# Patient Record
Sex: Female | Born: 1935 | Race: White | Hispanic: No | Smoking: Never smoker
Health system: Southern US, Community
[De-identification: ages and names within clinical notes are randomized; demographics above are authoritative.]

## PROBLEM LIST (undated history)

## (undated) DIAGNOSIS — I639 Cerebral infarction, unspecified: Secondary | ICD-10-CM

## (undated) DIAGNOSIS — E7800 Pure hypercholesterolemia, unspecified: Secondary | ICD-10-CM

## (undated) DIAGNOSIS — I10 Essential (primary) hypertension: Secondary | ICD-10-CM

## (undated) DIAGNOSIS — I2510 Atherosclerotic heart disease of native coronary artery without angina pectoris: Secondary | ICD-10-CM

## (undated) HISTORY — PX: CHOLECYSTECTOMY: SHX55

---

## 2007-10-11 ENCOUNTER — Ambulatory Visit (HOSPITAL_COMMUNITY): Admission: RE | Admit: 2007-10-11 | Discharge: 2007-10-11 | Payer: Self-pay | Admitting: Family Medicine

## 2007-10-12 ENCOUNTER — Encounter: Payer: Self-pay | Admitting: Family Medicine

## 2007-10-15 ENCOUNTER — Ambulatory Visit (HOSPITAL_COMMUNITY): Admission: RE | Admit: 2007-10-15 | Discharge: 2007-10-15 | Payer: Self-pay | Admitting: Interventional Radiology

## 2008-09-29 ENCOUNTER — Ambulatory Visit (HOSPITAL_COMMUNITY): Admission: RE | Admit: 2008-09-29 | Discharge: 2008-09-29 | Payer: Self-pay | Admitting: Interventional Radiology

## 2010-05-01 ENCOUNTER — Other Ambulatory Visit (HOSPITAL_COMMUNITY): Payer: Self-pay | Admitting: Interventional Radiology

## 2010-05-01 DIAGNOSIS — I771 Stricture of artery: Secondary | ICD-10-CM

## 2010-05-04 ENCOUNTER — Ambulatory Visit (HOSPITAL_COMMUNITY): Payer: No Typology Code available for payment source

## 2010-05-04 ENCOUNTER — Ambulatory Visit (HOSPITAL_COMMUNITY)
Admission: RE | Admit: 2010-05-04 | Discharge: 2010-05-04 | Disposition: A | Discharge status: Home / Self Care | Payer: No Typology Code available for payment source | Source: Ambulatory Visit | Attending: Interventional Radiology | Admitting: Interventional Radiology

## 2010-05-04 DIAGNOSIS — I658 Occlusion and stenosis of other precerebral arteries: Secondary | ICD-10-CM | POA: Insufficient documentation

## 2010-05-04 DIAGNOSIS — R42 Dizziness and giddiness: Secondary | ICD-10-CM | POA: Insufficient documentation

## 2010-05-04 DIAGNOSIS — M6281 Muscle weakness (generalized): Secondary | ICD-10-CM | POA: Insufficient documentation

## 2010-05-04 DIAGNOSIS — I771 Stricture of artery: Secondary | ICD-10-CM

## 2010-05-04 DIAGNOSIS — I679 Cerebrovascular disease, unspecified: Secondary | ICD-10-CM | POA: Insufficient documentation

## 2010-05-04 LAB — BUN: BUN: 12 mg/dL (ref 6–23)

## 2010-05-04 LAB — CREATININE, SERUM: GFR calc Af Amer: >60 mL/min (ref >60)

## 2010-05-04 MED ORDER — GADOBENATE DIMEGLUMINE 529 MG/ML IV SOLN
18.0000 mL | Freq: Once | INTRAVENOUS | Status: AC
  Administered 2010-05-04: 18 mL via INTRAVENOUS

## 2010-06-17 LAB — CREATININE, SERUM: GFR calc non Af Amer: >60 mL/min (ref >60)

## 2010-12-07 LAB — BASIC METABOLIC PANEL
Chloride: 108
GFR calc Af Amer: >60
Potassium: 4
Sodium: 141

## 2010-12-07 LAB — CBC
HCT: 40
Hemoglobin: 13.1
MCV: 89.9
RBC: 4.46
WBC: 6.1

## 2010-12-07 LAB — PROTIME-INR: Prothrombin Time: 13.8

## 2011-09-23 ENCOUNTER — Telehealth (HOSPITAL_COMMUNITY): Payer: Self-pay

## 2011-09-23 ENCOUNTER — Other Ambulatory Visit (HOSPITAL_COMMUNITY): Payer: Self-pay | Admitting: Interventional Radiology

## 2011-09-23 DIAGNOSIS — I639 Cerebral infarction, unspecified: Secondary | ICD-10-CM

## 2011-09-23 DIAGNOSIS — Z8679 Personal history of other diseases of the circulatory system: Secondary | ICD-10-CM

## 2011-09-23 NOTE — Telephone Encounter (Signed)
I spoke with Ann Frye in regards to it being time for her f/u with Dr. Corliss Skains.  She stated that she had a mini stroke in March 2013.  Dr. Corliss Skains has requested a consult with the pt and for her to bring in a disc of the current scans that she had done at [facility]

## 2011-09-30 ENCOUNTER — Other Ambulatory Visit (HOSPITAL_COMMUNITY): Payer: Self-pay | Admitting: Interventional Radiology

## 2011-09-30 ENCOUNTER — Ambulatory Visit (HOSPITAL_COMMUNITY)
Admission: RE | Admit: 2011-09-30 | Discharge: 2011-09-30 | Disposition: A | Discharge status: Home / Self Care | Payer: No Typology Code available for payment source | Source: Ambulatory Visit | Attending: Interventional Radiology | Admitting: Interventional Radiology

## 2011-09-30 DIAGNOSIS — Z8679 Personal history of other diseases of the circulatory system: Secondary | ICD-10-CM

## 2011-09-30 DIAGNOSIS — I639 Cerebral infarction, unspecified: Secondary | ICD-10-CM

## 2011-10-01 ENCOUNTER — Other Ambulatory Visit: Payer: Self-pay | Admitting: Radiology

## 2011-10-03 ENCOUNTER — Encounter (HOSPITAL_COMMUNITY): Payer: Self-pay

## 2011-10-03 ENCOUNTER — Ambulatory Visit (HOSPITAL_COMMUNITY)
Admission: RE | Admit: 2011-10-03 | Discharge: 2011-10-03 | Disposition: A | Discharge status: Home / Self Care | Payer: No Typology Code available for payment source | Source: Ambulatory Visit | Attending: Interventional Radiology | Admitting: Interventional Radiology

## 2011-10-03 ENCOUNTER — Other Ambulatory Visit (HOSPITAL_COMMUNITY): Payer: Self-pay | Admitting: Interventional Radiology

## 2011-10-03 DIAGNOSIS — I2510 Atherosclerotic heart disease of native coronary artery without angina pectoris: Secondary | ICD-10-CM | POA: Insufficient documentation

## 2011-10-03 DIAGNOSIS — R42 Dizziness and giddiness: Secondary | ICD-10-CM | POA: Insufficient documentation

## 2011-10-03 DIAGNOSIS — I639 Cerebral infarction, unspecified: Secondary | ICD-10-CM

## 2011-10-03 DIAGNOSIS — I10 Essential (primary) hypertension: Secondary | ICD-10-CM | POA: Insufficient documentation

## 2011-10-03 DIAGNOSIS — Z8673 Personal history of transient ischemic attack (TIA), and cerebral infarction without residual deficits: Secondary | ICD-10-CM | POA: Insufficient documentation

## 2011-10-03 DIAGNOSIS — E785 Hyperlipidemia, unspecified: Secondary | ICD-10-CM | POA: Insufficient documentation

## 2011-10-03 DIAGNOSIS — I6529 Occlusion and stenosis of unspecified carotid artery: Secondary | ICD-10-CM | POA: Insufficient documentation

## 2011-10-03 DIAGNOSIS — I658 Occlusion and stenosis of other precerebral arteries: Secondary | ICD-10-CM | POA: Insufficient documentation

## 2011-10-03 HISTORY — DX: Essential (primary) hypertension: I10

## 2011-10-03 HISTORY — DX: Cerebral infarction, unspecified: I63.9

## 2011-10-03 HISTORY — DX: Pure hypercholesterolemia, unspecified: E78.00

## 2011-10-03 HISTORY — DX: Atherosclerotic heart disease of native coronary artery without angina pectoris: I25.10

## 2011-10-03 LAB — CBC WITH DIFFERENTIAL/PLATELET
Basophils Absolute: 0.1 10*3/uL (ref 0.0–0.1)
Basophils Relative: 1 % (ref 0–1)
Eosinophils Relative: 4 % (ref 0–5)
HCT: 44.2 % (ref 36.0–46.0)
Lymphocytes Relative: 30 % (ref 12–46)
MCHC: 33.3 g/dL (ref 30.0–36.0)
MCV: 84.7 fL (ref 78.0–100.0)
Monocytes Absolute: 0.8 10*3/uL (ref 0.1–1.0)
Monocytes Relative: 11 % (ref 3–12)
RDW: 13.5 % (ref 11.5–15.5)

## 2011-10-03 LAB — BASIC METABOLIC PANEL
BUN: 12 mg/dL (ref 6–23)
CO2: 26 mEq/L (ref 19–32)
Chloride: 104 mEq/L (ref 96–112)
Creatinine, Ser: 0.68 mg/dL (ref 0.50–1.10)

## 2011-10-03 MED ORDER — FENTANYL CITRATE 0.05 MG/ML IJ SOLN
INTRAMUSCULAR | Status: AC | PRN
  Administered 2011-10-03: 12.5 ug via INTRAVENOUS
  Administered 2011-10-03: 25 ug via INTRAVENOUS

## 2011-10-03 MED ORDER — IOHEXOL 300 MG/ML  SOLN
150.0000 mL | Freq: Once | INTRAMUSCULAR | Status: AC | PRN
  Administered 2011-10-03: 75 mL via INTRAVENOUS

## 2011-10-03 MED ORDER — HEPARIN SODIUM (PORCINE) 1000 UNIT/ML IJ SOLN
INTRAMUSCULAR | Status: AC | PRN
  Administered 2011-10-03: 500 [IU] via INTRAVENOUS

## 2011-10-03 MED ORDER — FENTANYL CITRATE 0.05 MG/ML IJ SOLN
INTRAMUSCULAR | Status: AC
  Filled 2011-10-03: qty 2

## 2011-10-03 MED ORDER — SODIUM CHLORIDE 0.9 % IV SOLN: INTRAVENOUS | Status: AC

## 2011-10-03 MED ORDER — MIDAZOLAM HCL 2 MG/2ML IJ SOLN
INTRAMUSCULAR | Status: AC
  Filled 2011-10-03: qty 2

## 2011-10-03 MED ORDER — SODIUM CHLORIDE 0.9 % IV SOLN
Freq: Once | INTRAVENOUS | Status: AC
  Administered 2011-10-03: 1000 mL via INTRAVENOUS

## 2011-10-03 MED ORDER — MIDAZOLAM HCL 5 MG/5ML IJ SOLN
INTRAMUSCULAR | Status: AC | PRN
  Administered 2011-10-03: 0.5 mg via INTRAVENOUS
  Administered 2011-10-03: 1 mg via INTRAVENOUS

## 2011-10-03 NOTE — ED Notes (Signed)
exoseal to R groin successful

## 2011-10-03 NOTE — H&P (Signed)
Ann Frye is an 78 y.o. female.   Chief Complaint: CVA 05/2011; MRI shows Left vertebral artery stenosis Pt stes she has occasional dizziness especially in mornings Feels like she tends to go to the left when she first gets up Last cerebral arteriogram 10/2007 - see report Scheduled for cerebral arteriogram to evaluate stenosis HPI: CAD; HTN; CVA/TIAs; hyperlipidemia  Past Medical History  Diagnosis Date  . Coronary artery disease     stent x 2  . Hypertension   . Stroke   . High cholesterol     Past Surgical History  Procedure Date  . Cholecystectomy     History reviewed. No pertinent family history. Social History:  reports that she has never smoked. She does not have any smokeless tobacco history on file. Her alcohol and drug histories not on file.  Allergies: No Known Allergies   (Not in a hospital admission)  Results for orders placed during the hospital encounter of 10/03/11 (from the past 48 hour(s))  APTT     Status: Normal   Collection Time   10/03/11  6:37 AM      Component Value Range Comment   aPTT 29  24 - 37 seconds   BASIC METABOLIC PANEL     Status: Abnormal   Collection Time   10/03/11  6:37 AM      Component Value Range Comment   Sodium 142  135 - 145 mEq/L    Potassium 3.8  3.5 - 5.1 mEq/L    Chloride 104  96 - 112 mEq/L    CO2 26  19 - 32 mEq/L    Glucose, Bld 110 (*) 70 - 99 mg/dL    BUN 12  6 - 23 mg/dL    Creatinine, Ser 9.56  0.50 - 1.10 mg/dL    Calcium 9.3  8.4 - 21.3 mg/dL    GFR calc non Af Amer 80 (*) >90 mL/min    GFR calc Af Amer >90  >90 mL/min   CBC WITH DIFFERENTIAL     Status: Abnormal   Collection Time   10/03/11  6:37 AM      Component Value Range Comment   WBC 7.0  4.0 - 10.5 K/uL    RBC 5.22 (*) 3.87 - 5.11 MIL/uL    Hemoglobin 14.7  12.0 - 15.0 g/dL    HCT 08.6  57.8 - 46.9 %    MCV 84.7  78.0 - 100.0 fL    MCH 28.2  26.0 - 34.0 pg    MCHC 33.3  30.0 - 36.0 g/dL    RDW 62.9  52.8 - 41.3 %    Platelets 231  150 - 400  K/uL    Neutrophils Relative 55  43 - 77 %    Neutro Abs 3.8  1.7 - 7.7 K/uL    Lymphocytes Relative 30  12 - 46 %    Lymphs Abs 2.1  0.7 - 4.0 K/uL    Monocytes Relative 11  3 - 12 %    Monocytes Absolute 0.8  0.1 - 1.0 K/uL    Eosinophils Relative 4  0 - 5 %    Eosinophils Absolute 0.3  0.0 - 0.7 K/uL    Basophils Relative 1  0 - 1 %    Basophils Absolute 0.1  0.0 - 0.1 K/uL   PROTIME-INR     Status: Normal   Collection Time   10/03/11  6:37 AM      Component Value Range Comment  Prothrombin Time 13.1  11.6 - 15.2 seconds    INR 0.97  0.00 - 1.49   GLUCOSE, CAPILLARY     Status: Normal   Collection Time   10/03/11  6:46 AM      Component Value Range Comment   Glucose-Capillary 99  70 - 99 mg/dL    No results found.  Review of Systems  Constitutional: Negative for fever.  Gastrointestinal: Negative for nausea and vomiting.  Neurological: Positive for dizziness and headaches.  Psychiatric/Behavioral: The patient is nervous/anxious.     Blood pressure 121/62, pulse 56, temperature 97.7 F (36.5 C), temperature source Oral, resp. rate 18, height 5' 6.5" (1.689 m), weight 161 lb (73.029 kg), SpO2 94.00%. Physical Exam  Constitutional: She is oriented to person, place, and time. She appears well-developed and well-nourished.  Cardiovascular: Normal rate, regular rhythm and normal heart sounds.   No murmur heard. Respiratory: Effort normal and breath sounds normal. She has no wheezes.  GI: Bowel sounds are normal. There is no tenderness.  Musculoskeletal: Normal range of motion.       Steady but slow; pt states she "tends to go to left when first gets up in am"  Neurological: She is alert and oriented to person, place, and time.  Psychiatric: She has a normal mood and affect. Her behavior is normal. Judgment and thought content normal.     Assessment/Plan CVA 05/2011 Dizziness occasionally; gait to left in am MRI shows L VA stenosis Scheduled now or cerebral arteriogram    Pt aware of procedure benefits and risks and agreeable to proceed. Consent signed and in chart  Pritesh Sobecki A 10/03/2011, 7:53 AM

## 2011-10-03 NOTE — Procedures (Signed)
S/P 4 vessel cerebral arteriogram RT CFA approach . Preliminary findings  1. App 30 % stenosis LT VBJ  2.Mild ASVD of Lt carotid bulb.

## 2011-10-10 ENCOUNTER — Ambulatory Visit (HOSPITAL_COMMUNITY): Payer: No Typology Code available for payment source

## 2012-05-27 ENCOUNTER — Other Ambulatory Visit: Payer: Self-pay | Admitting: Radiology

## 2012-05-27 DIAGNOSIS — I6529 Occlusion and stenosis of unspecified carotid artery: Secondary | ICD-10-CM

## 2012-05-29 ENCOUNTER — Telehealth (HOSPITAL_COMMUNITY): Payer: Self-pay | Admitting: Interventional Radiology

## 2012-08-07 ENCOUNTER — Other Ambulatory Visit (HOSPITAL_COMMUNITY): Payer: Self-pay | Admitting: Interventional Radiology

## 2012-08-07 DIAGNOSIS — R42 Dizziness and giddiness: Secondary | ICD-10-CM

## 2012-08-10 ENCOUNTER — Ambulatory Visit (HOSPITAL_COMMUNITY)
Admission: RE | Admit: 2012-08-10 | Discharge: 2012-08-10 | Disposition: A | Discharge status: Home / Self Care | Payer: Medicare Other | Source: Ambulatory Visit | Attending: Radiology | Admitting: Radiology

## 2012-08-10 ENCOUNTER — Ambulatory Visit (HOSPITAL_COMMUNITY)
Admission: RE | Admit: 2012-08-10 | Discharge: 2012-08-10 | Disposition: A | Discharge status: Home / Self Care | Payer: Medicare Other | Source: Ambulatory Visit | Attending: Interventional Radiology | Admitting: Interventional Radiology

## 2012-08-10 DIAGNOSIS — R42 Dizziness and giddiness: Secondary | ICD-10-CM

## 2012-08-10 DIAGNOSIS — I6529 Occlusion and stenosis of unspecified carotid artery: Secondary | ICD-10-CM

## 2012-08-10 DIAGNOSIS — I658 Occlusion and stenosis of other precerebral arteries: Secondary | ICD-10-CM | POA: Insufficient documentation

## 2012-08-10 NOTE — Progress Notes (Signed)
VASCULAR LAB PRELIMINARY  PRELIMINARY  PRELIMINARY  PRELIMINARY  Carotid duplex completed.    Preliminary report:  Right 40% to 59% ICA stenosis lower end of range. Vertebral artery flow is antegrade. Left - Less than 40% ICA stenosis. Vertebral artery flow is antegrade. There is no indication of significant change from carotid angiogram of 10/03/2011.  Duriel Deery, RVS 08/10/2012, 2:05 PM

## 2012-08-17 ENCOUNTER — Ambulatory Visit (HOSPITAL_COMMUNITY): Payer: No Typology Code available for payment source

## 2013-03-31 ENCOUNTER — Other Ambulatory Visit: Payer: Self-pay | Admitting: Radiology

## 2013-03-31 DIAGNOSIS — I6529 Occlusion and stenosis of unspecified carotid artery: Secondary | ICD-10-CM

## 2013-04-01 ENCOUNTER — Telehealth (HOSPITAL_COMMUNITY): Payer: Self-pay | Admitting: Interventional Radiology

## 2013-04-01 NOTE — Telephone Encounter (Signed)
Called pt's home # no answer and no VM JM

## 2013-04-01 NOTE — Telephone Encounter (Signed)
Called - left VM to call to schedule f/u appt JM

## 2013-04-13 ENCOUNTER — Ambulatory Visit (HOSPITAL_COMMUNITY)
Admission: RE | Admit: 2013-04-13 | Discharge: 2013-04-13 | Disposition: A | Discharge status: Home / Self Care | Payer: Medicare Other | Source: Ambulatory Visit | Attending: Interventional Radiology | Admitting: Interventional Radiology

## 2013-04-13 DIAGNOSIS — I658 Occlusion and stenosis of other precerebral arteries: Secondary | ICD-10-CM | POA: Insufficient documentation

## 2013-04-13 DIAGNOSIS — I6529 Occlusion and stenosis of unspecified carotid artery: Secondary | ICD-10-CM

## 2013-04-13 NOTE — Progress Notes (Signed)
Bilateral carotid artery duplex:  1-39% ICA stenosis.  Vertebral artery flow is antegrade.     

## 2013-04-19 ENCOUNTER — Telehealth (HOSPITAL_COMMUNITY): Payer: Self-pay | Admitting: Interventional Radiology

## 2013-04-19 NOTE — Telephone Encounter (Signed)
Called pt's daughter, left VM that we will f/u again with US in 6 months. JM

## 2013-04-20 ENCOUNTER — Encounter: Payer: Self-pay | Admitting: Interventional Radiology

## 2013-11-09 ENCOUNTER — Other Ambulatory Visit: Payer: Self-pay | Admitting: Radiology

## 2013-11-09 DIAGNOSIS — I6529 Occlusion and stenosis of unspecified carotid artery: Secondary | ICD-10-CM

## 2013-12-03 ENCOUNTER — Ambulatory Visit (HOSPITAL_COMMUNITY)
Admission: RE | Admit: 2013-12-03 | Discharge: 2013-12-03 | Disposition: A | Discharge status: Home / Self Care | Payer: Medicare Other | Source: Ambulatory Visit | Attending: Interventional Radiology | Admitting: Interventional Radiology

## 2013-12-03 DIAGNOSIS — I6529 Occlusion and stenosis of unspecified carotid artery: Secondary | ICD-10-CM

## 2013-12-03 NOTE — Progress Notes (Signed)
*  PRELIMINARY RESULTS* Vascular Ultrasound Carotid Duplex (Doppler) has been completed.  Preliminary findings: Bilateral:  1-39% ICA stenosis.  Vertebral artery flow is antegrade.      Farrel Demark, RDMS, RVT  12/03/2013, 2:07 PM

## 2013-12-09 ENCOUNTER — Telehealth (HOSPITAL_COMMUNITY): Payer: Self-pay | Admitting: Interventional Radiology

## 2013-12-09 NOTE — Telephone Encounter (Signed)
Called pt, told her that her next US would be due in 6 months time. She states understanding and is in agreement with this plan of care. JM

## 2014-06-03 ENCOUNTER — Other Ambulatory Visit: Payer: Self-pay | Admitting: Radiology

## 2014-06-03 DIAGNOSIS — I6529 Occlusion and stenosis of unspecified carotid artery: Secondary | ICD-10-CM

## 2014-06-17 ENCOUNTER — Ambulatory Visit (HOSPITAL_COMMUNITY)
Admission: RE | Admit: 2014-06-17 | Discharge: 2014-06-17 | Disposition: A | Discharge status: Home / Self Care | Payer: Medicare Other | Source: Ambulatory Visit | Attending: Radiology | Admitting: Radiology

## 2014-06-17 DIAGNOSIS — I6529 Occlusion and stenosis of unspecified carotid artery: Secondary | ICD-10-CM | POA: Diagnosis not present

## 2014-06-17 DIAGNOSIS — I6523 Occlusion and stenosis of bilateral carotid arteries: Secondary | ICD-10-CM | POA: Insufficient documentation

## 2014-06-17 NOTE — Progress Notes (Addendum)
Bilateral carotid artery duplex completed:  1-39% ICA stenosis.  Vertebral artery flow is antegrade.     

## 2014-08-03 ENCOUNTER — Other Ambulatory Visit (HOSPITAL_COMMUNITY): Payer: Self-pay | Admitting: Interventional Radiology

## 2014-08-03 DIAGNOSIS — M542 Cervicalgia: Secondary | ICD-10-CM

## 2014-08-03 DIAGNOSIS — R42 Dizziness and giddiness: Secondary | ICD-10-CM

## 2014-08-03 DIAGNOSIS — R413 Other amnesia: Secondary | ICD-10-CM

## 2014-08-05 ENCOUNTER — Ambulatory Visit (HOSPITAL_COMMUNITY)
Admission: RE | Admit: 2014-08-05 | Discharge: 2014-08-05 | Disposition: A | Discharge status: Home / Self Care | Payer: Medicare Other | Source: Ambulatory Visit | Attending: Interventional Radiology | Admitting: Interventional Radiology

## 2014-08-05 DIAGNOSIS — G319 Degenerative disease of nervous system, unspecified: Secondary | ICD-10-CM | POA: Insufficient documentation

## 2014-08-05 DIAGNOSIS — R9082 White matter disease, unspecified: Secondary | ICD-10-CM | POA: Diagnosis not present

## 2014-08-05 DIAGNOSIS — I739 Peripheral vascular disease, unspecified: Secondary | ICD-10-CM | POA: Insufficient documentation

## 2014-08-05 DIAGNOSIS — M542 Cervicalgia: Secondary | ICD-10-CM

## 2014-08-05 DIAGNOSIS — R413 Other amnesia: Secondary | ICD-10-CM

## 2014-08-05 DIAGNOSIS — R42 Dizziness and giddiness: Secondary | ICD-10-CM

## 2014-08-18 ENCOUNTER — Telehealth (HOSPITAL_COMMUNITY): Payer: Self-pay | Admitting: Interventional Radiology

## 2014-08-18 NOTE — Telephone Encounter (Signed)
Pt's daughter called and wanted to know the results of her mother's latest MRI report. Told pt's daughter that the MRI was stable and per Deveshwar that she would need a 6 month f/u US of the carotid arteries. She states understanding. JM

## 2014-11-29 DIAGNOSIS — I2510 Atherosclerotic heart disease of native coronary artery without angina pectoris: Secondary | ICD-10-CM | POA: Insufficient documentation

## 2014-11-29 DIAGNOSIS — E7800 Pure hypercholesterolemia, unspecified: Secondary | ICD-10-CM | POA: Insufficient documentation

## 2014-11-29 DIAGNOSIS — I10 Essential (primary) hypertension: Secondary | ICD-10-CM | POA: Insufficient documentation

## 2015-01-10 ENCOUNTER — Other Ambulatory Visit (HOSPITAL_COMMUNITY): Payer: Self-pay | Admitting: Interventional Radiology

## 2015-04-13 DIAGNOSIS — F32A Depression, unspecified: Secondary | ICD-10-CM | POA: Insufficient documentation

## 2015-04-13 DIAGNOSIS — I669 Occlusion and stenosis of unspecified cerebral artery: Secondary | ICD-10-CM | POA: Insufficient documentation

## 2015-04-13 DIAGNOSIS — R7301 Impaired fasting glucose: Secondary | ICD-10-CM | POA: Insufficient documentation

## 2015-04-13 DIAGNOSIS — L409 Psoriasis, unspecified: Secondary | ICD-10-CM | POA: Insufficient documentation

## 2015-04-13 DIAGNOSIS — F430 Acute stress reaction: Secondary | ICD-10-CM | POA: Insufficient documentation

## 2015-04-13 DIAGNOSIS — F320 Major depressive disorder, single episode, mild: Secondary | ICD-10-CM | POA: Insufficient documentation

## 2015-04-13 DIAGNOSIS — E039 Hypothyroidism, unspecified: Secondary | ICD-10-CM | POA: Insufficient documentation

## 2015-04-13 DIAGNOSIS — E559 Vitamin D deficiency, unspecified: Secondary | ICD-10-CM | POA: Insufficient documentation

## 2015-04-13 DIAGNOSIS — G4700 Insomnia, unspecified: Secondary | ICD-10-CM | POA: Insufficient documentation

## 2015-04-13 DIAGNOSIS — I2510 Atherosclerotic heart disease of native coronary artery without angina pectoris: Secondary | ICD-10-CM | POA: Insufficient documentation

## 2015-04-13 DIAGNOSIS — M810 Age-related osteoporosis without current pathological fracture: Secondary | ICD-10-CM | POA: Insufficient documentation

## 2015-04-25 ENCOUNTER — Telehealth (HOSPITAL_COMMUNITY): Payer: Self-pay

## 2015-04-25 NOTE — Telephone Encounter (Signed)
Called to schedule f/u US Carotid with Dr. Corliss Skains. Left VM for pt's daughter to call back. AW

## 2015-05-09 DIAGNOSIS — R0789 Other chest pain: Secondary | ICD-10-CM | POA: Insufficient documentation

## 2015-05-18 ENCOUNTER — Other Ambulatory Visit (HOSPITAL_COMMUNITY): Payer: Self-pay | Admitting: Interventional Radiology

## 2015-05-18 DIAGNOSIS — G450 Vertebro-basilar artery syndrome: Secondary | ICD-10-CM

## 2015-05-29 ENCOUNTER — Telehealth (HOSPITAL_COMMUNITY): Payer: Self-pay | Admitting: Interventional Radiology

## 2015-05-29 NOTE — Telephone Encounter (Signed)
Pt's daughter called and stated that pt wanted to cancel her US carotid Doppler study that was scheduled for 07/12/15. She said she would call us back to reschedule when her [facility]

## 2015-07-03 ENCOUNTER — Encounter (HOSPITAL_COMMUNITY): Payer: Medicare Other

## 2015-07-04 ENCOUNTER — Other Ambulatory Visit (HOSPITAL_COMMUNITY): Payer: Self-pay | Admitting: Interventional Radiology

## 2015-07-04 DIAGNOSIS — G450 Vertebro-basilar artery syndrome: Secondary | ICD-10-CM

## 2015-07-12 ENCOUNTER — Encounter (HOSPITAL_COMMUNITY): Payer: Medicare Other

## 2015-07-13 ENCOUNTER — Ambulatory Visit (HOSPITAL_COMMUNITY)
Admission: RE | Admit: 2015-07-13 | Discharge: 2015-07-13 | Disposition: A | Discharge status: Home / Self Care | Payer: Medicare Other | Source: Ambulatory Visit | Attending: Interventional Radiology | Admitting: Interventional Radiology

## 2015-07-13 DIAGNOSIS — G450 Vertebro-basilar artery syndrome: Secondary | ICD-10-CM

## 2015-07-13 DIAGNOSIS — I6523 Occlusion and stenosis of bilateral carotid arteries: Secondary | ICD-10-CM | POA: Diagnosis not present

## 2015-07-13 DIAGNOSIS — R413 Other amnesia: Secondary | ICD-10-CM | POA: Insufficient documentation

## 2015-07-13 NOTE — Progress Notes (Signed)
VASCULAR LAB PRELIMINARY  PRELIMINARY  PRELIMINARY  PRELIMINARY  Carotid duplex completed.    Preliminary report:  Bilateral:  1-39% ICA stenosis.  Vertebral artery flow is antegrade.     Ann Frye, RVS 07/13/2015, 3:34 PM

## 2015-07-20 ENCOUNTER — Telehealth (HOSPITAL_COMMUNITY): Payer: Self-pay

## 2015-07-20 NOTE — Telephone Encounter (Signed)
Called to have pt f/u in 6 months with an US Carotid. Spoke with pt's daughter and she agreed with this plan. AW

## 2016-01-12 DIAGNOSIS — F03918 Unspecified dementia, unspecified severity, with other behavioral disturbance: Secondary | ICD-10-CM | POA: Insufficient documentation

## 2016-01-12 DIAGNOSIS — F0391 Unspecified dementia with behavioral disturbance: Secondary | ICD-10-CM | POA: Insufficient documentation

## 2016-01-18 ENCOUNTER — Other Ambulatory Visit (HOSPITAL_COMMUNITY): Payer: Self-pay | Admitting: Interventional Radiology

## 2016-01-18 DIAGNOSIS — I771 Stricture of artery: Secondary | ICD-10-CM

## 2016-02-05 ENCOUNTER — Ambulatory Visit (HOSPITAL_COMMUNITY): Payer: Medicare Other

## 2016-02-12 ENCOUNTER — Ambulatory Visit (HOSPITAL_COMMUNITY)
Admission: RE | Admit: 2016-02-12 | Discharge: 2016-02-12 | Disposition: A | Discharge status: Home / Self Care | Payer: Medicare Other | Source: Ambulatory Visit | Attending: Interventional Radiology | Admitting: Interventional Radiology

## 2016-02-12 DIAGNOSIS — I771 Stricture of artery: Secondary | ICD-10-CM | POA: Insufficient documentation

## 2016-02-12 LAB — VAS US CAROTID
LCCADDIAS: -18 cm/s
LCCAPDIAS: 19 cm/s
LCCAPSYS: 88 cm/s
LEFT ECA DIAS: -8 cm/s
LEFT VERTEBRAL DIAS: -11 cm/s
LICADDIAS: -27 cm/s
LICADSYS: -71 cm/s
LICAPSYS: -108 cm/s
Left CCA dist sys: -66 cm/s
Left ICA prox dias: -24 cm/s
RCCADSYS: -81 cm/s
RIGHT ECA DIAS: -7 cm/s
RIGHT VERTEBRAL DIAS: -11 cm/s
Right CCA prox dias: 17 cm/s
Right CCA prox sys: 83 cm/s

## 2016-02-12 NOTE — Progress Notes (Signed)
*  PRELIMINARY RESULTS* Vascular Ultrasound Carotid Duplex (Doppler) has been completed.  Preliminary findings: Findings consistent with high end 40 - 59 percent stenosis involving the proximal right internal carotid artery.  Findings consistent with a 1- 39 percent stenosis involving the left internal carotid artery. Bilateral vertebral arteries are patent and antegrade.  Ann FischerCharlotte C Jessen Frye 02/12/2016, 12:13 PM

## 2016-02-15 ENCOUNTER — Telehealth (HOSPITAL_COMMUNITY): Payer: Self-pay

## 2016-02-15 NOTE — Telephone Encounter (Signed)
Pt agreed to f/u in 6 months with us carotid. AW 

## 2016-03-19 DIAGNOSIS — E538 Deficiency of other specified B group vitamins: Secondary | ICD-10-CM | POA: Insufficient documentation

## 2016-05-07 ENCOUNTER — Encounter: Payer: Self-pay | Admitting: Osteopathic Medicine

## 2016-05-07 ENCOUNTER — Ambulatory Visit (INDEPENDENT_AMBULATORY_CARE_PROVIDER_SITE_OTHER): Payer: Medicare HMO | Admitting: Osteopathic Medicine

## 2016-05-07 VITALS — BP 128/66 | HR 90 | Ht 66.5 in | Wt 145.0 lb

## 2016-05-07 DIAGNOSIS — R413 Other amnesia: Secondary | ICD-10-CM

## 2016-05-07 DIAGNOSIS — G4700 Insomnia, unspecified: Secondary | ICD-10-CM | POA: Diagnosis not present

## 2016-05-07 DIAGNOSIS — I10 Essential (primary) hypertension: Secondary | ICD-10-CM

## 2016-05-07 DIAGNOSIS — F32A Depression, unspecified: Secondary | ICD-10-CM

## 2016-05-07 DIAGNOSIS — F320 Major depressive disorder, single episode, mild: Secondary | ICD-10-CM | POA: Diagnosis not present

## 2016-05-07 DIAGNOSIS — F0391 Unspecified dementia with behavioral disturbance: Secondary | ICD-10-CM

## 2016-05-07 MED ORDER — NORTRIPTYLINE HCL 50 MG PO CAPS: ORAL_CAPSULE | ORAL | 1 refills | Status: DC
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
## 2016-06-04 ENCOUNTER — Encounter: Payer: Self-pay | Admitting: Osteopathic Medicine

## 2016-06-04 ENCOUNTER — Ambulatory Visit (INDEPENDENT_AMBULATORY_CARE_PROVIDER_SITE_OTHER): Payer: Medicare HMO | Admitting: Osteopathic Medicine

## 2016-06-04 VITALS — BP 146/66 | HR 91 | Ht 66.5 in | Wt 143.0 lb

## 2016-06-04 DIAGNOSIS — Z8639 Personal history of other endocrine, nutritional and metabolic disease: Secondary | ICD-10-CM | POA: Diagnosis not present

## 2016-06-04 DIAGNOSIS — Z780 Asymptomatic menopausal state: Secondary | ICD-10-CM | POA: Diagnosis not present

## 2016-06-04 DIAGNOSIS — W57XXXA Bitten or stung by nonvenomous insect and other nonvenomous arthropods, initial encounter: Secondary | ICD-10-CM

## 2016-06-04 DIAGNOSIS — H1131 Conjunctival hemorrhage, right eye: Secondary | ICD-10-CM | POA: Diagnosis not present

## 2016-06-04 DIAGNOSIS — F320 Major depressive disorder, single episode, mild: Secondary | ICD-10-CM | POA: Diagnosis not present

## 2016-06-04 DIAGNOSIS — F0391 Unspecified dementia with behavioral disturbance: Secondary | ICD-10-CM | POA: Diagnosis not present

## 2016-06-04 DIAGNOSIS — Z0000 Encounter for general adult medical examination without abnormal findings: Secondary | ICD-10-CM | POA: Diagnosis not present

## 2016-06-04 DIAGNOSIS — F32A Depression, unspecified: Secondary | ICD-10-CM

## 2016-06-04 MED ORDER — HYDROXYZINE HCL 25 MG PO TABS: 25.0000 mg | ORAL_TABLET | Freq: Three times a day (TID) | ORAL | 1 refills | Status: DC | PRN

## 2016-06-04 MED ORDER — TRIAMCINOLONE ACETONIDE 0.1 % EX CREA: 1.0000 "application " | TOPICAL_CREAM | Freq: Two times a day (BID) | CUTANEOUS | 1 refills | Status: DC

## 2016-06-04 MED ORDER — NITROGLYCERIN 0.4 MG SL SUBL: 0.4000 mg | SUBLINGUAL_TABLET | SUBLINGUAL | 1 refills | Status: DC | PRN

## 2016-06-04 NOTE — Progress Notes (Signed)
HPI: Ann FennelDarleen Frye is a 83 y.o. female  who [facility] today, 06/04/16,  for chief complaint of:  Chief Complaint  Patient presents with  . Follow-up    DEPRESSION  . Eye Problem    RIGHT EYE RED     Depression/Anxiety: Acclimating to new living environment but still bothered by noises from adjacent units (music, dishwashers, comings and goings of neighbors). Overall doing well on new medicine and PHQ reflects this. Dementia comorbid, no change   Eye concern: . Context: no known injuty . Location: Right eye . Quality:red, nonpainful . Duration: 2 weeks maybe  Rash: . Location: arms, legs, neck . Quality: itching . Duration: few weeks, not sure . Context: brings bugs in plastic bag with her ... Bedbugs!  Inquires about B12 supplementation, used ot get shots.   Patient is accompanied by daughter who assists with history-taking.   Past medical history, surgical history, social history and family history reviewed.  Patient Active Problem List   Diagnosis Date Noted  . Vitamin B12 deficiency 03/19/2016  . Dementia with behavioral disturbance 01/12/2016  . Memory difficulty 07/13/2015  . Atypical chest pain 05/09/2015  . Acute stress reaction 04/13/2015  . Age related osteoporosis 04/13/2015  . Cerebral artery occlusion 04/13/2015  . Coronary artery stenosis 04/13/2015  . Hypothyroidism 04/13/2015  . IFG (impaired fasting glucose) 04/13/2015  . Insomnia 04/13/2015  . Mild depression (HCC) 04/13/2015  . Psoriasis 04/13/2015  . Vitamin D deficiency 04/13/2015  . ASCVD (arteriosclerotic cardiovascular disease) 11/29/2014  . Hypercholesteremia 11/29/2014  . Hypertension 11/29/2014    Current medication list and allergy/intolerance information reviewed.   Current Outpatient Prescriptions on File Prior to Visit  Medication Sig Dispense Refill  . alendronate (FOSAMAX) 70 MG tablet Take 70 mg by mouth every 7 (seven) days.      Marland Kitchen. aspirin 81 MG tablet Take 81 mg by mouth daily.    . clopidogrel (PLAVIX) 75 MG tablet Take 75 mg by mouth daily.    Marland Kitchen. donepezil (ARICEPT) 10 MG tablet Take 10 mg by mouth at bedtime.    Marland Kitchen. levothyroxine (SYNTHROID, LEVOTHROID) 25 MCG tablet Take 25 mcg by mouth daily.    . metoprolol (LOPRESSOR) 50 MG tablet Take 50 mg by mouth 2 (two) times daily.    . nortriptyline (PAMELOR) 50 MG capsule Take 1 tablet (50 mg total) by mouth in the morning and 2 tablets (100 mg total) by mouth in the evening 90 capsule 1  . rosuvastatin (CRESTOR) 40 MG tablet Take 40 mg by mouth at bedtime.     No current facility-administered medications on file prior to visit.    No Known Allergies    Review of Systems:  Constitutional: No recent illness  HEENT: No  headache, no vision change, red eye as per HPI  Cardiac: No  chest pain,  Respiratory:  No  shortness of breath. No  Musculoskeletal: No new myalgia/arthralgia  Skin: +Rash  Neurologic: No  weakness, No  Dizziness  Psychiatric: No  concerns with depression, No  concerns with anxiety  Exam:  BP (!) 146/66   Pulse 91   Ht 5' 6.5" (1.689 m)   Wt 143 lb (64.9 kg)   BMI 22.74 kg/m   Constitutional: VS see above. General Appearance: alert, well-developed, well-nourished, NAD  Eyes: Normal lids and conjunctive, non-icteric sclera  Ears, Nose, Mouth, Throat: MMM, Normal external inspection ears/nares/mouth/lips/gums.  Neck: No masses, trachea midline.   Respiratory: Normal respiratory effort.  no wheeze, no rhonchi, no rales  Cardiovascular: S1/S2 normal, no murmur, no rub/gallop auscultated. RRR.   Musculoskeletal: Gait normal. Symmetric and independent movement of all extremities  Neurological: Normal balance/coordination. No tremor.  Skin: warm, dry, intact. (+)maculopapulat rash, excoriations   Psychiatric: Normal judgment/insight. Normal mood and affect. Oriented x3.         Depression screen [facility] 2/9 06/04/2016   Decreased Interest 0  Down, Depressed, Hopeless 0  PHQ - 2 Score 0      ASSESSMENT/PLAN:   Mild depression (HCC) - doing well on current meds, refills x6 mos ok  Subconjunctival hemorrhage of right eye - f/u ophtho if no better. Nonpainful and no vision compromise  Postmenopausal - Plan: VITAMIN D 25 Hydroxy (Vit-D Deficiency, Fractures)  History of non anemic vitamin B12 deficiency - Plan: Vitamin B12  Annual physical exam - Plan: CBC with Differential/Platelet, COMPLETE METABOLIC PANEL WITH GFR, Lipid panel, TSH, VITAMIN D 25 Hydroxy (Vit-D Deficiency, Fractures), Vitamin B12  Insect bite, initial encounter - bedbugs - printed infor given, talk to landlord ASAP - Plan: triamcinolone cream (KENALOG) 0.1 %, hydrOXYzine (ATARAX/VISTARIL) 25 MG tablet  Dementia with behavioral disturbance, unspecified dementia type    Follow-up plan: Return in about 6 weeks (around 07/16/2016) for ANNUAL PHYSICAL. Note annual labs were ordered today but preventive care exam was not performed or billed  Visit summary with medication list and pertinent instructions was printed for patient to review, alert [place] if any changes needed. All questions at time of visit were answered - patient instructed to contact office with any additional concerns. ER/RTC precautions were reviewed with the patient and understanding verbalized.

## 2016-06-12 ENCOUNTER — Telehealth: Payer: Self-pay

## 2016-06-12 NOTE — Telephone Encounter (Signed)
PT needs a refill on this medication. Please send to pharmacy on file.  Nortriptyline  - 90 day supply

## 2016-06-13 MED ORDER — NORTRIPTYLINE HCL 50 MG PO CAPS: ORAL_CAPSULE | ORAL | 1 refills | Status: DC

## 2016-06-13 NOTE — Telephone Encounter (Signed)
Refill request for Nortriptyline sent to patient pharmacy. Ann Frye,CMA

## 2016-06-13 NOTE — Addendum Note (Signed)
Addended by: Pixie Casino on: 06/13/2016 09:54 AM   Modules accepted: Orders

## 2016-07-09 ENCOUNTER — Encounter: Payer: Medicare HMO | Admitting: Osteopathic Medicine

## 2016-07-19 ENCOUNTER — Ambulatory Visit: Payer: Medicare HMO | Admitting: Sports Medicine

## 2016-08-14 ENCOUNTER — Other Ambulatory Visit (HOSPITAL_COMMUNITY): Payer: Self-pay | Admitting: Interventional Radiology

## 2016-08-14 DIAGNOSIS — I771 Stricture of artery: Secondary | ICD-10-CM

## 2016-09-04 ENCOUNTER — Encounter (HOSPITAL_COMMUNITY): Payer: Medicare HMO

## 2016-09-10 ENCOUNTER — Encounter (HOSPITAL_COMMUNITY): Payer: Self-pay

## 2016-09-10 ENCOUNTER — Ambulatory Visit (HOSPITAL_COMMUNITY): Payer: Medicare HMO

## 2016-09-21 DIAGNOSIS — I25118 Atherosclerotic heart disease of native coronary artery with other forms of angina pectoris: Secondary | ICD-10-CM | POA: Insufficient documentation

## 2016-10-14 ENCOUNTER — Ambulatory Visit (INDEPENDENT_AMBULATORY_CARE_PROVIDER_SITE_OTHER): Payer: Medicare HMO | Admitting: Osteopathic Medicine

## 2016-10-14 ENCOUNTER — Encounter: Payer: Self-pay | Admitting: Osteopathic Medicine

## 2016-10-14 VITALS — BP 115/68 | HR 80 | Resp 18 | Wt 137.1 lb

## 2016-10-14 DIAGNOSIS — Z8673 Personal history of transient ischemic attack (TIA), and cerebral infarction without residual deficits: Secondary | ICD-10-CM

## 2016-10-14 DIAGNOSIS — F320 Major depressive disorder, single episode, mild: Secondary | ICD-10-CM | POA: Diagnosis not present

## 2016-10-14 DIAGNOSIS — E538 Deficiency of other specified B group vitamins: Secondary | ICD-10-CM | POA: Diagnosis not present

## 2016-10-14 DIAGNOSIS — G4700 Insomnia, unspecified: Secondary | ICD-10-CM | POA: Diagnosis not present

## 2016-10-14 DIAGNOSIS — E559 Vitamin D deficiency, unspecified: Secondary | ICD-10-CM | POA: Diagnosis not present

## 2016-10-14 DIAGNOSIS — F32A Depression, unspecified: Secondary | ICD-10-CM

## 2016-10-14 DIAGNOSIS — I2510 Atherosclerotic heart disease of native coronary artery without angina pectoris: Secondary | ICD-10-CM | POA: Diagnosis not present

## 2016-10-14 MED ORDER — NORTRIPTYLINE HCL 50 MG PO CAPS: ORAL_CAPSULE | ORAL | 3 refills | Status: DC

## 2016-10-14 NOTE — Progress Notes (Signed)
HPI: Ann Frye is a 83 y.o. female  who presents to [facility] today, 10/14/16,  for chief complaint of:  Chief Complaint  Patient presents with  . Follow-up    Depression/Anxiety: Doing well on current medications. Taking nortriptyline 1 in the morning and 2 in the evening of the 50 mg pills. On home medication list, escitalopram is noted but the patient doesn't think she is taking this.   Inquires about B12 supplementation, used to get shots. Patient did not get labs as ordered back in March.  History of TIA: On Plavix, not sure how long she was supposed to stay on this medicine. Has not had follow-up with her neurologist for some time. MRI shows chronic small vessel disease, no major infarcts. She is also taking low-dose aspirin. She is on Crestor 40 mg.  Memory problems: Doing well on donepezil. No major changes/cognitive decline.  Patient is accompanied by daughter who assists with history-taking.   Past medical history, surgical history, social history and family history reviewed.  Patient Active Problem List   Diagnosis Date Noted  . Vitamin B12 deficiency 03/19/2016  . Dementia with behavioral disturbance 01/12/2016  . Memory difficulty 07/13/2015  . Atypical chest pain 05/09/2015  . Acute stress reaction 04/13/2015  . Age related osteoporosis 04/13/2015  . Cerebral artery occlusion 04/13/2015  . Coronary artery stenosis 04/13/2015  . Hypothyroidism 04/13/2015  . IFG (impaired fasting glucose) 04/13/2015  . Insomnia 04/13/2015  . Mild depression (HCC) 04/13/2015  . Psoriasis 04/13/2015  . Vitamin D deficiency 04/13/2015  . ASCVD (arteriosclerotic cardiovascular disease) 11/29/2014  . Hypercholesteremia 11/29/2014  . Hypertension 11/29/2014    Current medication list and allergy/intolerance information reviewed.   Current Outpatient Prescriptions on File Prior to Visit  Medication Sig Dispense Refill  . alendronate (FOSAMAX)  70 MG tablet Take 70 mg by mouth every 7 (seven) days.    Marland Kitchen aspirin 81 MG tablet Take 81 mg by mouth daily.    . clopidogrel (PLAVIX) 75 MG tablet Take 75 mg by mouth daily.    Marland Kitchen donepezil (ARICEPT) 10 MG tablet Take 10 mg by mouth at bedtime.    Marland Kitchen levothyroxine (SYNTHROID, LEVOTHROID) 25 MCG tablet Take 25 mcg by mouth daily.    . metoprolol (LOPRESSOR) 50 MG tablet Take 50 mg by mouth 2 (two) times daily.    . nitroGLYCERIN (NITROSTAT) 0.4 MG SL tablet Place 1 tablet (0.4 mg total) under the tongue every 5 (five) minutes as needed. For chest pain 90 tablet 1  . rosuvastatin (CRESTOR) 40 MG tablet Take 40 mg by mouth at bedtime.     No current facility-administered medications on file prior to visit.    No Known Allergies    Review of Systems:  Constitutional: No recent illness  HEENT: No  headache, no vision change, red eye as per HPI  Cardiac: No  chest pain,  Respiratory:  No  shortness of breath. No  Musculoskeletal: No new myalgia/arthralgia  Neurologic: No  weakness, No  Dizziness  Psychiatric: No  concerns with depression, No  concerns with anxiety  Exam:  BP 115/68   Pulse 80   Resp 18   Wt 137 lb 1.6 oz (62.2 kg)   BMI 21.80 kg/m   Constitutional: VS see above. General Appearance: alert, well-developed, well-nourished, NAD  Eyes: Normal lids and conjunctive, non-icteric sclera  Ears, Nose, Mouth, Throat: MMM, Normal external inspection ears/nares/mouth/lips/gums.  Neck: No masses, trachea midline.   Respiratory: Normal respiratory  effort. no wheeze, no rhonchi, no rales  Cardiovascular: S1/S2 normal, no murmur, no rub/gallop auscultated. RRR.   Musculoskeletal: Gait normal. Symmetric and independent movement of all extremities  Neurological: Normal balance/coordination. No tremor.  Skin: warm, dry, intact. (+)maculopapulat rash, excoriations   Psychiatric: Normal judgment/insight. Normal mood and affect. Oriented x3.    [facility]  06/04/2016  Decreased Interest 0  Down, Depressed, Hopeless 0  PHQ - 2 Score 0   No results found for this or any previous visit (from the past 2160 hour(s)).  Labs were re-printed for patient, we need updated blood work for further management.   ASSESSMENT/PLAN:   Mild depression (HCC) - Doing well on TCA, also covering for insomnia issues. Family will confirm whether patient is still taking the citalopram but it sounds like she isn't.  Insomnia, unspecified type - Doing well on TCA as noted above  ASCVD (arteriosclerotic cardiovascular disease) - Previously seen by cardiologist, unable to follow-up since new insurance/location. Would like to establish with a cardiologist for routine care/follow-up - Plan: Ambulatory referral to Cardiology  Vitamin D deficiency  Vitamin B12 deficiency  History of transient ischemic attack (TIA) - Caution w/ ASA plus Plavix - advised f/u with neurology.     Follow-up plan: Return in about 6 months (around 04/16/2017) for [facility]NUAL [facility]YSICAL, sooner if needed . Note annual labs were ordered today but preventive care exam was not performed or billed  Visit summary with medication list and pertinent instructions was printed for patient to review, alert us if any changes needed. All questions at time of visit were answered - patient instructed to contact office with any additional concerns. ER/RTC precautions were reviewed with the patient and understanding verbalized.

## 2016-10-29 LAB — CBC WITH DIFFERENTIAL/PLATELET
BASOS ABS: 72 {cells}/uL (ref 0–200)
Basophils Relative: 1 %
EOS PCT: 2 %
Eosinophils Absolute: 144 cells/uL (ref 15–500)
HCT: 43.6 % (ref 35.0–45.0)
Hemoglobin: 14.7 g/dL (ref 11.7–15.5)
Lymphocytes Relative: 30 %
Lymphs Abs: 2160 cells/uL (ref 850–3900)
MCH: 28.3 pg (ref 27.0–33.0)
MCHC: 33.7 g/dL (ref 32.0–36.0)
MCV: 83.8 fL (ref 80.0–100.0)
MONOS PCT: 9 %
MPV: 9.1 fL (ref 7.5–12.5)
Monocytes Absolute: 648 cells/uL (ref 200–950)
Neutro Abs: 4176 cells/uL (ref 1500–7800)
Neutrophils Relative %: 58 %
PLATELETS: 252 10*3/uL (ref 140–400)
RBC: 5.2 MIL/uL — ABNORMAL HIGH (ref 3.80–5.10)
RDW: 14.2 % (ref 11.0–15.0)
WBC: 7.2 10*3/uL (ref 3.8–10.8)

## 2016-10-29 LAB — COMPLETE METABOLIC PANEL WITH GFR
ALT: 29 U/L (ref 6–29)
AST: 34 U/L (ref 10–35)
Albumin: 4.3 g/dL (ref 3.6–5.1)
Alkaline Phosphatase: 56 U/L (ref 33–130)
BUN: 10 mg/dL (ref 7–25)
CHLORIDE: 104 mmol/L (ref 98–110)
CO2: 27 mmol/L (ref 20–32)
Calcium: 9.3 mg/dL (ref 8.6–10.4)
Creat: 0.79 mg/dL (ref 0.60–0.88)
GFR, EST NON AFRICAN AMERICAN: 68 mL/min (ref >=60)
GFR, Est African American: 78 mL/min (ref >=60)
GLUCOSE: 88 mg/dL (ref 65–99)
POTASSIUM: 4.1 mmol/L (ref 3.5–5.3)
SODIUM: 141 mmol/L (ref 135–146)
TOTAL PROTEIN: 6.7 g/dL (ref 6.1–8.1)
Total Bilirubin: 1.2 mg/dL (ref 0.2–1.2)

## 2016-10-29 LAB — LIPID PANEL
Cholesterol: 161 mg/dL (ref <200)
HDL: 64 mg/dL (ref >50)
LDL Cholesterol: 80 mg/dL (ref <100)
Total CHOL/HDL Ratio: 2.5 Ratio (ref <5.0)
Triglycerides: 83 mg/dL (ref <150)
VLDL: 17 mg/dL (ref <30)

## 2016-10-29 LAB — TSH: TSH: 2.69 m[IU]/L

## 2016-10-30 LAB — VITAMIN B12: Vitamin B-12: 857 pg/mL (ref 200–1100)

## 2016-10-30 LAB — VITAMIN D 25 HYDROXY (VIT D DEFICIENCY, FRACTURES): Vit D, 25-Hydroxy: 31 ng/mL (ref 30–100)

## 2016-11-15 DIAGNOSIS — M21372 Foot drop, left foot: Secondary | ICD-10-CM | POA: Insufficient documentation

## 2016-11-27 ENCOUNTER — Other Ambulatory Visit: Payer: Self-pay | Admitting: Osteopathic Medicine

## 2016-11-30 ENCOUNTER — Other Ambulatory Visit: Payer: Self-pay | Admitting: Osteopathic Medicine

## 2017-01-01 DIAGNOSIS — S065X9A Traumatic subdural hemorrhage with loss of consciousness of unspecified duration, initial encounter: Secondary | ICD-10-CM | POA: Insufficient documentation

## 2017-01-01 DIAGNOSIS — G939 Disorder of brain, unspecified: Secondary | ICD-10-CM

## 2017-01-01 DIAGNOSIS — R9089 Other abnormal findings on diagnostic imaging of central nervous system: Secondary | ICD-10-CM | POA: Insufficient documentation

## 2017-01-01 DIAGNOSIS — S065XAA Traumatic subdural hemorrhage with loss of consciousness status unknown, initial encounter: Secondary | ICD-10-CM | POA: Insufficient documentation

## 2017-01-01 DIAGNOSIS — W19XXXA Unspecified fall, initial encounter: Secondary | ICD-10-CM | POA: Insufficient documentation

## 2017-01-02 DIAGNOSIS — R768 Other specified abnormal immunological findings in serum: Secondary | ICD-10-CM | POA: Insufficient documentation

## 2017-01-02 DIAGNOSIS — R7689 Other specified abnormal immunological findings in serum: Secondary | ICD-10-CM | POA: Insufficient documentation

## 2017-01-14 DIAGNOSIS — W19XXXA Unspecified fall, initial encounter: Secondary | ICD-10-CM | POA: Insufficient documentation

## 2017-01-14 DIAGNOSIS — S0990XA Unspecified injury of head, initial encounter: Secondary | ICD-10-CM | POA: Insufficient documentation

## 2017-02-05 ENCOUNTER — Telehealth: Payer: Self-pay | Admitting: Emergency Medicine

## 2017-02-05 NOTE — Telephone Encounter (Signed)
Sounds good

## 2017-02-05 NOTE — Telephone Encounter (Signed)
Case worker Pam called requesting orders for this patient; I suggested she list the orders needed and fax them to us so we can systematically go through them and send appropriate orders. She will do this.pk

## 2017-02-06 ENCOUNTER — Ambulatory Visit (INDEPENDENT_AMBULATORY_CARE_PROVIDER_SITE_OTHER): Payer: Medicare HMO | Admitting: Osteopathic Medicine

## 2017-02-06 ENCOUNTER — Encounter: Payer: Self-pay | Admitting: Osteopathic Medicine

## 2017-02-06 VITALS — BP 130/71 | HR 86 | Temp 98.3°F | Resp 16 | Wt 122.0 lb

## 2017-02-06 DIAGNOSIS — E871 Hypo-osmolality and hyponatremia: Secondary | ICD-10-CM

## 2017-02-06 DIAGNOSIS — S065XAA Traumatic subdural hemorrhage with loss of consciousness status unknown, initial encounter: Secondary | ICD-10-CM

## 2017-02-06 DIAGNOSIS — I25118 Atherosclerotic heart disease of native coronary artery with other forms of angina pectoris: Secondary | ICD-10-CM

## 2017-02-06 DIAGNOSIS — I2510 Atherosclerotic heart disease of native coronary artery without angina pectoris: Secondary | ICD-10-CM | POA: Diagnosis not present

## 2017-02-06 DIAGNOSIS — F0391 Unspecified dementia with behavioral disturbance: Secondary | ICD-10-CM | POA: Diagnosis not present

## 2017-02-06 DIAGNOSIS — R262 Difficulty in walking, not elsewhere classified: Secondary | ICD-10-CM | POA: Diagnosis not present

## 2017-02-06 DIAGNOSIS — S065X9A Traumatic subdural hemorrhage with loss of consciousness of unspecified duration, initial encounter: Secondary | ICD-10-CM

## 2017-02-06 MED ORDER — ROLLER WALKER MISC: 1.0000 [IU] | 99 refills | Status: DC | PRN

## 2017-02-06 MED ORDER — NORTRIPTYLINE HCL 25 MG PO CAPS: 25.0000 mg | ORAL_CAPSULE | Freq: Every day | ORAL | 1 refills | Status: DC

## 2017-02-06 MED ORDER — MELATONIN 3 MG PO CAPS: 6.0000 mg | ORAL_CAPSULE | Freq: Every evening | ORAL | 11 refills | Status: DC | PRN

## 2017-02-06 MED ORDER — DIMENHYDRINATE 50 MG PO TABS: 50.0000 mg | ORAL_TABLET | Freq: Four times a day (QID) | ORAL | 0 refills | Status: DC | PRN

## 2017-02-06 MED ORDER — BATH BENCH WITH BACK MISC: 1.0000 [IU] | 99 refills | Status: DC | PRN

## 2017-02-06 MED ORDER — METOPROLOL TARTRATE 25 MG PO TABS: 25.0000 mg | ORAL_TABLET | Freq: Four times a day (QID) | ORAL | 3 refills | Status: DC

## 2017-02-06 NOTE — Patient Instructions (Addendum)
Plan:  Will get home health orders once they send it to me to sign off on  OK to restart Nortriptyline, let's try lower dose and increase as tolerated - should help anxiety/depression and sleep issues   Labs today to recheck sodium levels

## 2017-02-06 NOTE — Progress Notes (Signed)
HPI: Ann Frye is a 83 y.o. female who  has a past medical history of Coronary artery disease, High cholesterol, Hypertension, and Stroke (HCC).  [facility] today, 02/06/17,  for chief complaint of:  Chief Complaint  Patient presents with  . Follow-up    Hospital - Fall 12/31/2016    Discharge summary reviewed: admitted for subdural hematoma on 01/01/17 and d/c 01/817. Hx CVA 2012. Seizure-like activity 01/11/17. Hyponatremia. Changed ASA to low dose, D/C Plavix. See below for other meds on discharge - these were reconciled.   Today needs: Patent examinerWalker Bench for shower Order for PT/OT  HTN: BP ok today  Depression: SNF d/c nortriptyline d/t concerns for possible side effects. She reports anxiety and insomnia much worse lately.    Patient is accompanied by daughter, Ann Frye, who assists with history-taking.   Past medical, surgical, social and family history reviewed:  Patient Active Problem List   Diagnosis Date Noted  . Vitamin B12 deficiency 03/19/2016  . Dementia with behavioral disturbance 01/12/2016  . Memory difficulty 07/13/2015  . Atypical chest pain 05/09/2015  . Acute stress reaction 04/13/2015  . Age related osteoporosis 04/13/2015  . Cerebral artery occlusion 04/13/2015  . Coronary artery stenosis 04/13/2015  . Hypothyroidism 04/13/2015  . IFG (impaired fasting glucose) 04/13/2015  . Insomnia 04/13/2015  . Mild depression (HCC) 04/13/2015  . Psoriasis 04/13/2015  . Vitamin D deficiency 04/13/2015  . ASCVD (arteriosclerotic cardiovascular disease) 11/29/2014  . Hypercholesteremia 11/29/2014  . Hypertension 11/29/2014    Past Surgical History:  Procedure Laterality Date  . CHOLECYSTECTOMY      Social History   Tobacco Use  . Smoking status: Never Smoker  . Smokeless tobacco: Never Used  Substance Use Topics  . Alcohol use: Not on file    Current medication list and allergy/intolerance  information reviewed:    Discharge Medication List: Current Discharge Medication List   START taking these medications  Details  acetaminophen (TYLENOL) 325 MG tablet Take 2 tablets (650 mg total) by mouth every 4 (four) hours as needed (Mild pain or fever > 101.5 F).  amLODIPine (NORVASC) 10 MG tablet Take 1 tablet (10 mg total) by mouth daily.  HYDROcodone-acetaminophen (NORCO) 5-325 mg per tablet Take 1 tablet by mouth every 6 (six) hours as needed for up to 7 days for Severe pain. Qty: 10 tablet, Refills: 0  levETIRAcetam (KEPPRA) 750 MG tablet Take 2 tablets (1,500 mg total) by mouth 2 times daily.  lisinopril (PRINIVIL,ZESTRIL) 40 MG tablet Take 1 tablet (40 mg total) by mouth daily.  melatonin 3 mg Tab Take 2 tablets (6 mg total) by mouth nightly. Refills: 0  metoPROLOL tartrate (LOPRESSOR) 25 MG tablet Take 1 tablet (25 mg total) by mouth every 6 (six) hours.  polyethylene glycol (MIRALAX) 17 gram packet Take 17 g by mouth 2 times daily.  sodium chloride 1 gram tablet Take 1 tablet (1,000 mg total) by mouth 3 times daily.  tamsulosin (FLOMAX) 0.4 mg Cp24 capsule Take 1 capsule (0.4 mg total) by mouth daily.   CONTINUE these medications which have CHANGED  Details  aspirin 81 MG EC tablet *ANTIPLATELET* Take 1 tablet (81 mg total) by mouth daily. May resume on 01/29/17   CONTINUE these medications which have NOT CHANGED  Details  alendronate (FOSAMAX) 70 MG tablet TAKE 1 TABLET(70 MG) BY MOUTH EVERY 7 DAYS Qty: 12 tablet, Refills: 3  cholecalciferol, vitamin D3, 2,000 unit Cap Take 6,000 Units by mouth daily.  Associated Diagnoses: Vitamin D deficiency  cyanocobalamin (VITAMIN B-12) 1000 MCG tablet Take 1,000 mcg by mouth daily.  dimenhyDRINATE (DRAMAMINE) 50 MG tablet Take 50 mg by mouth nightly as needed.  donepezil (ARICEPT) 10 MG tablet TAKE 1 TABLET NIGHTLY. Qty: 90 tablet, Refills: 0  escitalopram oxalate (LEXAPRO) 10 MG tablet Take 1 tablet (10 mg total) by mouth  daily. Qty: 30 tablet, Refills: 2  levothyroxine (SYNTHROID, LEVOTHROID) 25 MCG tablet Take 25 mcg by mouth.  multivitamin Chew Take 1 tablet by mouth daily.  nitroglycerin (NITROSTAT) 0.4 MG SL tablet Place 0.4 mg under the tongue every 5 (five) minutes as needed for Chest pain.  nortriptyline (PAMELOR) 50 MG capsule Take 50 mg by mouth 2 times daily.  rosuvastatin (CRESTOR) 40 MG tablet TAKE 1 TABLET BY MOUTH AT BEDTIME Qty: 90 tablet, Refills: 3  meclizine (ANTIVERT) 25 MG tablet Take 25 mg by mouth 3 (three) times daily as needed.    STOP taking these medications  clopidogrel (PLAVIX) 75 mg tablet *ANTIPLATELET*  metoPROLOL succinate (TOPROL-XL) 50 MG 24 hr tablet     Allergies  Allergen Reactions  . Codeine   . Metoclopramide Other (See Comments)    unknown   . Zinc Other (See Comments)    unknown       Review of Systems:  Constitutional:  No  fever, no chills  HEENT: No  headache, no vision change  Cardiac: No  chest pain, No  pressure, No palpitations  Respiratory:  No  shortness of breath. No  Cough  Gastrointestinal: No  abdominal pain, No  nausea  Neurologic: No  weakness, No  dizziness, No  slurred speech/focal weakness/facial droop  Psychiatric: +concerns with depression, +concerns with anxiety, +sleep problems, +mood problems  Exam:  BP 130/71 (BP Location: Right Arm, Patient Position: Sitting, Cuff Size: Large)   Pulse 86   Temp 98.3 F (36.8 C) (Oral)   Resp 16   Wt 122 lb (55.3 kg)   SpO2 94%   BMI 19.40 kg/m   Constitutional: VS see above. General Appearance: alert, well-developed, well-nourished, NAD  Eyes: Normal lids and conjunctive, non-icteric sclera  Ears, Nose, Mouth, Throat: MMM, Normal external inspection ears/nares/mouth/lips/gums.   Neck: No masses, trachea midline.   Respiratory: Normal respiratory effort. no wheeze, no rhonchi, no rales  Cardiovascular: S1/S2 normal, no murmur, no rub/gallop auscultated. RRR.    Musculoskeletal: Gait normal. No clubbing/cyanosis of digits.   Neurological: Normal balance/coordination. No tremor.   Skin: warm, dry, intact. No rash/ulcer.    Psychiatric: Normal judgment/insight. Normal mood and affect. Oriented x3.    No results found for this or any previous visit (from the past 72 hour(s)).  No results found.   ASSESSMENT/PLAN:   SDH (subdural hematoma) (HCC) - following with neurosurgery  ASCVD (arteriosclerotic cardiovascular disease) - d.c plavix d/t above   Dementia with behavioral disturbance, unspecified dementia type - agitation and depression/anxiety, will restart nortriptyline   Coronary artery disease of native heart with stable angina pectoris, unspecified vessel or lesion type (HCC)  Hyponatremia - not on sodium tabs, wlil recheck levels - Plan: BASIC METABOLIC PANEL WITH GFR    Patient Instructions  Plan:  Will get home health orders once they send it to me to sign off on  OK to restart Nortriptyline, let's try lower dose and increase as tolerated - should help anxiety/depression and sleep issues   Labs today to recheck sodium levels     Visit summary with medication list and pertinent instructions  was printed for patient to review. All questions at time of visit were answered - patient instructed to contact office with any additional concerns. ER/RTC precautions were reviewed with the patient. Follow-up plan: Return in about 3 months (around 05/08/2017) for recheck chronic medical issues .  Note: Total time spent 25 minutes, greater than 50% of the visit was spent face-to-face counseling and coordinating care for the following: The primary encounter diagnosis was SDH (subdural hematoma) (HCC). 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 Please contact Dr. Lyn Hollingshead for any needed clarifications.

## 2017-02-07 ENCOUNTER — Telehealth (HOSPITAL_COMMUNITY): Payer: Self-pay

## 2017-02-07 ENCOUNTER — Other Ambulatory Visit: Payer: Self-pay | Admitting: Osteopathic Medicine

## 2017-02-07 DIAGNOSIS — E876 Hypokalemia: Secondary | ICD-10-CM

## 2017-02-07 LAB — BASIC METABOLIC PANEL WITH GFR
BUN / CREAT RATIO: 15 (calc) (ref 6–22)
BUN: 7 mg/dL (ref 7–25)
CHLORIDE: 108 mmol/L (ref 98–110)
CO2: 27 mmol/L (ref 20–32)
Calcium: 8.9 mg/dL (ref 8.6–10.4)
Creat: 0.46 mg/dL — ABNORMAL LOW (ref 0.60–0.88)
GFR, EST AFRICAN AMERICAN: 104 mL/min/{1.73_m2} (ref >=60)
GFR, Est Non African American: 90 mL/min/{1.73_m2} (ref >=60)
Glucose, Bld: 101 mg/dL — ABNORMAL HIGH (ref 65–99)
POTASSIUM: 3.4 mmol/L — AB (ref 3.5–5.3)
Sodium: 144 mmol/L (ref 135–146)

## 2017-02-07 NOTE — Telephone Encounter (Signed)
Called pt's daughter to schedule us carotid, no answer, mailbox full. AW

## 2017-02-07 NOTE — Progress Notes (Signed)
Lab add on

## 2017-02-10 ENCOUNTER — Other Ambulatory Visit: Payer: Self-pay

## 2017-02-10 ENCOUNTER — Telehealth: Payer: Self-pay | Admitting: Osteopathic Medicine

## 2017-02-10 DIAGNOSIS — R262 Difficulty in walking, not elsewhere classified: Secondary | ICD-10-CM

## 2017-02-10 MED ORDER — ROLLER WALKER MISC: 99 refills | Status: DC

## 2017-02-10 NOTE — Telephone Encounter (Signed)
Sounds good, thanks.

## 2017-02-10 NOTE — Telephone Encounter (Signed)
Ann Frye from [facility] called for a verbal order for:  2x week for 2 weeks, then 1x week for one week.  Verbal order given.

## 2017-02-20 ENCOUNTER — Ambulatory Visit (INDEPENDENT_AMBULATORY_CARE_PROVIDER_SITE_OTHER): Payer: Medicare HMO

## 2017-02-20 ENCOUNTER — Ambulatory Visit: Payer: Medicare HMO | Admitting: Osteopathic Medicine

## 2017-02-20 ENCOUNTER — Encounter: Payer: Self-pay | Admitting: Osteopathic Medicine

## 2017-02-20 VITALS — BP 107/62 | HR 73 | Temp 97.7°F | Wt 123.1 lb

## 2017-02-20 DIAGNOSIS — R609 Edema, unspecified: Secondary | ICD-10-CM | POA: Diagnosis not present

## 2017-02-20 DIAGNOSIS — M79605 Pain in left leg: Secondary | ICD-10-CM | POA: Diagnosis not present

## 2017-02-20 DIAGNOSIS — I739 Peripheral vascular disease, unspecified: Secondary | ICD-10-CM

## 2017-02-20 NOTE — Progress Notes (Signed)
HPI: Ann Frye is a 83 y.o. female who  has a past medical history of Coronary artery disease, High cholesterol, Hypertension, and Stroke (HCC).  she presents to [facility] today, 02/20/17,  for chief complaint of:  Chief Complaint  Patient presents with  . Leg Swelling     . Context: concerned about blood poisoning, concerned about loose screw post-op site from fracture fixation 20+ years ago LLE . Location: bilateral lower legs . Quality: redness, mild swelling . Severity: better today  . Duration: 2-3 days . Timing: constant  . Modifying factors: walking and elevating legs has helped  . Assoc signs/symptoms: no SOB, no chest pain.      Past medical, surgical, social and family history reviewed:  Patient Active Problem List   Diagnosis Date Noted  . Ambulatory dysfunction 02/06/2017  . Hyponatremia 02/06/2017  . Accident due to mechanical fall without injury 01/14/2017  . Head trauma 01/14/2017  . Red blood cell antibody positive 01/02/2017  . Fall 01/01/2017  . Midline shift of brain 01/01/2017  . SDH (subdural hematoma) (HCC) 01/01/2017  . Foot drop, left 11/15/2016  . Coronary artery disease of native heart with stable angina pectoris (HCC) 09/21/2016  . Vitamin B12 deficiency 03/19/2016  . Dementia with behavioral disturbance 01/12/2016  . Memory difficulty 07/13/2015  . Atypical chest pain 05/09/2015  . Acute stress reaction 04/13/2015  . Age related osteoporosis 04/13/2015  . Cerebral artery occlusion 04/13/2015  . Coronary artery stenosis 04/13/2015  . Hypothyroidism 04/13/2015  . IFG (impaired fasting glucose) 04/13/2015  . Insomnia 04/13/2015  . Mild depression (HCC) 04/13/2015  . Psoriasis 04/13/2015  . Vitamin D deficiency 04/13/2015  . ASCVD (arteriosclerotic cardiovascular disease) 11/29/2014  . Hypercholesteremia 11/29/2014  . Hypertension 11/29/2014    Past Surgical History:  Procedure Laterality Date   . CHOLECYSTECTOMY      Social History   Tobacco Use  . Smoking status: Never Smoker  . Smokeless tobacco: Never Used  Substance Use Topics  . Alcohol use: Not on file    No family history on file.   Current medication list and allergy/intolerance information reviewed:    Current Outpatient Medications  Medication Sig Dispense Refill  . acetaminophen (TYLENOL) 325 MG tablet Take 650 mg by mouth as needed.    Marland Kitchen alendronate (FOSAMAX) 70 MG tablet Take 70 mg by mouth every 7 (seven) days.    Marland Kitchen amLODipine (NORVASC) 10 MG tablet Take 1 tablet by mouth daily.  0  . aspirin 81 MG tablet Take 81 mg by mouth daily.    . Cholecalciferol (VITAMIN D3) 2000 units capsule Take by mouth daily.    Marland Kitchen dimenhyDRINATE (DRAMAMINE) 50 MG tablet Take 1 tablet (50 mg total) by mouth every 6 (six) hours as needed (sleep). 30 tablet 0  . donepezil (ARICEPT) 10 MG tablet Take 10 mg by mouth at bedtime.    Marland Kitchen escitalopram (LEXAPRO) 10 MG tablet Take 1 tablet by mouth daily.  0  . FLUAD 0.5 ML SUSY Inject 0.5 mLs into the muscle as directed.  0  . HYDROcodone-acetaminophen (NORCO/VICODIN) 5-325 MG tablet Take 1 tablet by mouth as needed.  0  . levETIRAcetam (KEPPRA) 750 MG tablet Take 1,500 mg by mouth 2 (two) times daily.    Marland Kitchen levothyroxine (SYNTHROID, LEVOTHROID) 25 MCG tablet Take 25 mcg by mouth daily.    Marland Kitchen lisinopril (PRINIVIL,ZESTRIL) 40 MG tablet Take 1 tablet by mouth daily.  0  . meclizine (ANTIVERT) 25 MG  tablet Take 12.5-25 mg by mouth 3 (three) times daily as needed for dizziness or nausea.    . Melatonin 3 MG CAPS Take 2 capsules (6 mg total) by mouth at bedtime as needed (insomnia). 60 capsule 11  . metoprolol tartrate (LOPRESSOR) 25 MG tablet Take 1 tablet (25 mg total) by mouth every 6 (six) hours. 270 tablet 3  . Misc. Devices (BATH BENCH WITH BACK) MISC 1 Units by Does not apply route as needed (with showering/bathing). 1 each prn  . Misc. Devices (ROLLER WALKER) MISC Rollator walker with  back support. Dx R26.2 1 each prn  . nitroGLYCERIN (NITROSTAT) 0.4 MG SL tablet Place 1 tablet (0.4 mg total) under the tongue every 5 (five) minutes as needed. For chest pain 90 tablet 1  . nortriptyline (PAMELOR) 25 MG capsule Take 1 capsule (25 mg total) by mouth at bedtime. 90 capsule 1  . polyethylene glycol powder (GLYCOLAX/MIRALAX) powder Take 225 g by mouth as directed.  0  . rosuvastatin (CRESTOR) 40 MG tablet Take 40 mg by mouth at bedtime.    . tamsulosin (FLOMAX) 0.4 MG CAPS capsule Take 0.4 mg by mouth daily.    . vitamin B-12 (CYANOCOBALAMIN) 1000 MCG tablet Take 1 tablet by mouth daily.     No current facility-administered medications for this visit.     Allergies  Allergen Reactions  . Codeine   . Metoclopramide Other (See Comments)    unknown   . Zinc Other (See Comments)    unknown       Review of Systems:  Constitutional:  No  fever, no chills, No recent illness, No unintentional weight changes. No significant fatigue.   HEENT: No  headache, no vision change  Cardiac: No  chest pain, No  pressure, No palpitations, No  Orthopnea  Respiratory:  No  shortness of breath. No  Cough  Gastrointestinal: No  abdominal pain, No  nausea  Musculoskeletal: No new myalgia/arthralgia  Skin: No  Rash, No other wounds/concerning lesions  Neurologic: No  weakness, No  dizziness   Exam:  BP 107/62   Pulse 73   Temp 97.7 F (36.5 C) (Oral)   Wt 123 lb 1.3 oz (55.8 kg)   BMI 19.57 kg/m   Constitutional: VS see above. General Appearance: alert, well-developed, well-nourished, NAD  Eyes: Normal lids and conjunctive, non-icteric sclera  Ears, Nose, Mouth, Throat: MMM, Normal external inspection ears/nares/mouth/lips/gums.   Neck: No masses, trachea midline. No thyroid enlargement. No tenderness/mass appreciated. No lymphadenopathy  Respiratory: Normal respiratory effort. no wheeze, no rhonchi, no rales  Cardiovascular: S1/S2 normal, +mild systolic murmur at  RSB, no rub/gallop auscultated. RRR. Trace lower extremity edema. Pedal pulse II/IV bilaterally DP and PT. No carotid bruit or JVD.  Musculoskeletal: Gait normal. No clubbing/cyanosis of digits. Plapable surgical screw in LLE medial malleolus but feels stable to me  Neurological: Normal balance/coordination. No tremor.   Skin: warm, dry, intact. No rash/ulcer, small excoriation/abrasion LLE laterally but no erythema No concerning nevi or subq nodules on limited exam.    Psychiatric: Fair judgment/insight. Normal mood and affect. Oriented x3.    No results found for this or any previous visit (from the past 72 hour(s)).  No results found.   ASSESSMENT/PLAN:   Dependent edema - sounds like this improved on its own w/ elevation legs and walking, advised no clinical HF or s/s DVT or cellulitis, avoid scratching dry skin. Monitor  - Plan: CBC, COMPLETE METABOLIC PANEL WITH GFR, B Nat Peptide  Left  leg pain - Plan: DG Tibia/Fibula Left, Ambulatory referral to Orthopedic Surgery       Visit summary with medication list and pertinent instructions was printed for patient to review. All questions at time of visit were answered - patient instructed to contact office with any additional concerns. ER/RTC precautions were reviewed with the patient.   Follow-up plan: Return if symptoms worsen or fail to improve.  Note: Total time spent 25 minutes, greater than 50% of the visit was spent face-to-face counseling and coordinating care for the following: The primary encounter diagnosis was Dependent edema. A diagnosis of Left leg pain was also pertinent to this visit.Marland Kitchen.  Please note: voice recognition software was used to produce this document, and typos may escape review. Please contact Dr. Lyn HollingsheadAlexander for any needed clarifications.

## 2017-02-20 NOTE — Patient Instructions (Signed)
Edema Edema is when you have too much fluid in your body or under your skin. Edema may make your legs, feet, and ankles swell up. Swelling is also common in looser tissues, like around your eyes. This is a common condition. It gets more common as you get older. There are many possible causes of edema. Eating too much salt (sodium) and being on your feet or sitting for a long time can cause edema in your legs, feet, and ankles. Hot weather may make edema worse. Edema is usually painless. Your skin may look swollen or shiny. Follow these instructions at home:  Keep the swollen body part raised (elevated) above the level of your heart when you are sitting or lying down.  Do not sit still or stand for a long time.  Do not wear tight clothes. Do not wear garters on your upper legs.  Exercise your legs. This can help the swelling go down.  Wear elastic bandages or support stockings as told by your doctor.  Eat a low-salt (low-sodium) diet to reduce fluid as told by your doctor.  Depending on the cause of your swelling, you may need to limit how much fluid you drink (fluid restriction).  Take over-the-counter and prescription medicines only as told by your doctor. Contact a doctor if:  Treatment is not working.  You have heart, liver, or kidney disease and have symptoms of edema.  You have sudden and unexplained weight gain. Get help right away if:  You have shortness of breath or chest pain.  You cannot breathe when you lie down.  You have pain, redness, or warmth in the swollen areas.  You have heart, liver, or kidney disease and get edema all of a sudden.  You have a fever and your symptoms get worse all of a sudden. Summary  Edema is when you have too much fluid in your body or under your skin.  Edema may make your legs, feet, and ankles swell up. Swelling is also common in looser tissues, like around your eyes.  Raise (elevate) the swollen body part above the level of your  heart when you are sitting or lying down.  Follow your doctor's instructions about diet and how much fluid you can drink (fluid restriction). This information is not intended to replace advice given to you by your health care provider. Make sure you discuss any questions you have with your health care provider. Document Released: 08/14/2007 Document Revised: 03/15/2016 Document Reviewed: 03/15/2016 Elsevier Interactive Patient Education  2017 [employer]  

## 2017-02-21 ENCOUNTER — Other Ambulatory Visit: Payer: Self-pay | Admitting: Osteopathic Medicine

## 2017-02-21 DIAGNOSIS — R011 Cardiac murmur, unspecified: Secondary | ICD-10-CM

## 2017-02-21 DIAGNOSIS — R600 Localized edema: Secondary | ICD-10-CM

## 2017-02-21 LAB — CBC
HCT: 39.9 % (ref 35.0–45.0)
Hemoglobin: 13 g/dL (ref 11.7–15.5)
MCH: 28.3 pg (ref 27.0–33.0)
MCHC: 32.6 g/dL (ref 32.0–36.0)
MCV: 86.9 fL (ref 80.0–100.0)
MPV: 10 fL (ref 7.5–12.5)
PLATELETS: 254 10*3/uL (ref 140–400)
RBC: 4.59 10*6/uL (ref 3.80–5.10)
RDW: 13 % (ref 11.0–15.0)
WBC: 5.2 10*3/uL (ref 3.8–10.8)

## 2017-02-21 LAB — COMPLETE METABOLIC PANEL WITH GFR
AG Ratio: 1.8 (calc) (ref 1.0–2.5)
ALKALINE PHOSPHATASE (APISO): 55 U/L (ref 33–130)
ALT: 31 U/L — AB (ref 6–29)
AST: 39 U/L — AB (ref 10–35)
Albumin: 3.9 g/dL (ref 3.6–5.1)
BUN: 7 mg/dL (ref 7–25)
CALCIUM: 8.9 mg/dL (ref 8.6–10.4)
CO2: 28 mmol/L (ref 20–32)
CREATININE: 0.64 mg/dL (ref 0.60–0.88)
Chloride: 108 mmol/L (ref 98–110)
GFR, EST NON AFRICAN AMERICAN: 81 mL/min/{1.73_m2} (ref >=60)
GFR, Est African American: 94 mL/min/{1.73_m2} (ref >=60)
GLUCOSE: 109 mg/dL — AB (ref 65–99)
Globulin: 2.2 g/dL (calc) (ref 1.9–3.7)
POTASSIUM: 3.8 mmol/L (ref 3.5–5.3)
Sodium: 145 mmol/L (ref 135–146)
Total Bilirubin: 0.9 mg/dL (ref 0.2–1.2)
Total Protein: 6.1 g/dL (ref 6.1–8.1)

## 2017-02-21 LAB — BRAIN NATRIURETIC PEPTIDE: Brain Natriuretic Peptide: 216 pg/mL — ABNORMAL HIGH (ref <100)

## 2017-02-21 NOTE — Progress Notes (Signed)
echo

## 2017-02-22 ENCOUNTER — Encounter: Payer: Self-pay | Admitting: Osteopathic Medicine

## 2017-02-24 ENCOUNTER — Telehealth: Payer: Self-pay | Admitting: Osteopathic Medicine

## 2017-02-24 MED ORDER — LISINOPRIL 40 MG PO TABS: 40.0000 mg | ORAL_TABLET | Freq: Every day | ORAL | 3 refills | Status: DC

## 2017-02-24 MED ORDER — TAMSULOSIN HCL 0.4 MG PO CAPS: 0.4000 mg | ORAL_CAPSULE | Freq: Every day | ORAL | 3 refills | Status: DC

## 2017-02-24 MED ORDER — AMLODIPINE BESYLATE 10 MG PO TABS: 10.0000 mg | ORAL_TABLET | Freq: Every day | ORAL | 3 refills | Status: DC

## 2017-02-24 NOTE — Telephone Encounter (Signed)
Tamsulosin 0.4 mg. (1 daily at bedtime) Amlodipine Besylate 10mg . (1 daily) Lisinopril 40mg . (1 daily at breakfast)

## 2017-02-25 ENCOUNTER — Telehealth: Payer: Self-pay | Admitting: Osteopathic Medicine

## 2017-02-25 NOTE — Telephone Encounter (Signed)
Noted.  Cannot force the patient to participate

## 2017-02-25 NOTE — Telephone Encounter (Signed)
[facility] and wanted you to know that this pt missed her PT visit on 12/8 due her scheduling conflict of her own. Physical therapist tried to reschedule her for Saturday and pt declined to reschedule. KG LPN

## 2017-02-27 ENCOUNTER — Telehealth: Payer: Self-pay

## 2017-02-27 NOTE — Telephone Encounter (Signed)
Ok to extend PT - if they'll accept verbal order - cool - if not please ask them to fax order  FYI to them, they can always fax order rather than play phone tag with us

## 2017-02-28 NOTE — Telephone Encounter (Signed)
Left a brief voicemail message to return call back to office regarding extension for PT.

## 2017-03-06 ENCOUNTER — Other Ambulatory Visit: Payer: Self-pay

## 2017-03-06 ENCOUNTER — Encounter: Payer: Self-pay | Admitting: Osteopathic Medicine

## 2017-03-06 MED ORDER — LISINOPRIL 40 MG PO TABS: 40.0000 mg | ORAL_TABLET | Freq: Every day | ORAL | 3 refills | Status: DC

## 2017-03-06 MED ORDER — TAMSULOSIN HCL 0.4 MG PO CAPS: 0.4000 mg | ORAL_CAPSULE | Freq: Every day | ORAL | 3 refills | Status: DC

## 2017-03-07 MED ORDER — NORTRIPTYLINE HCL 25 MG PO CAPS: 25.0000 mg | ORAL_CAPSULE | Freq: Two times a day (BID) | ORAL | 1 refills | Status: DC

## 2017-03-12 NOTE — Telephone Encounter (Signed)
Spoke to Risa on 02/28/17, verbal order was given to extend PT for patient.

## 2017-04-16 ENCOUNTER — Encounter: Payer: Medicare HMO | Admitting: Osteopathic Medicine

## 2017-11-29 ENCOUNTER — Encounter (HOSPITAL_BASED_OUTPATIENT_CLINIC_OR_DEPARTMENT_OTHER): Payer: Self-pay | Admitting: Emergency Medicine

## 2017-11-29 ENCOUNTER — Other Ambulatory Visit: Payer: Self-pay

## 2017-11-29 ENCOUNTER — Emergency Department (HOSPITAL_BASED_OUTPATIENT_CLINIC_OR_DEPARTMENT_OTHER)
Admission: EM | Admit: 2017-11-29 | Discharge: 2017-11-29 | Disposition: A | Discharge status: Home / Self Care | Payer: Medicare HMO | Attending: Emergency Medicine | Admitting: Emergency Medicine

## 2017-11-29 DIAGNOSIS — I2510 Atherosclerotic heart disease of native coronary artery without angina pectoris: Secondary | ICD-10-CM | POA: Insufficient documentation

## 2017-11-29 DIAGNOSIS — Z7982 Long term (current) use of aspirin: Secondary | ICD-10-CM | POA: Insufficient documentation

## 2017-11-29 DIAGNOSIS — Y998 Other external cause status: Secondary | ICD-10-CM | POA: Insufficient documentation

## 2017-11-29 DIAGNOSIS — F0391 Unspecified dementia with behavioral disturbance: Secondary | ICD-10-CM | POA: Diagnosis not present

## 2017-11-29 DIAGNOSIS — I10 Essential (primary) hypertension: Secondary | ICD-10-CM | POA: Insufficient documentation

## 2017-11-29 DIAGNOSIS — S90861A Insect bite (nonvenomous), right foot, initial encounter: Secondary | ICD-10-CM | POA: Diagnosis not present

## 2017-11-29 DIAGNOSIS — Z8673 Personal history of transient ischemic attack (TIA), and cerebral infarction without residual deficits: Secondary | ICD-10-CM | POA: Diagnosis not present

## 2017-11-29 DIAGNOSIS — Y939 Activity, unspecified: Secondary | ICD-10-CM | POA: Insufficient documentation

## 2017-11-29 DIAGNOSIS — E7800 Pure hypercholesterolemia, unspecified: Secondary | ICD-10-CM | POA: Insufficient documentation

## 2017-11-29 DIAGNOSIS — Z79899 Other long term (current) drug therapy: Secondary | ICD-10-CM | POA: Insufficient documentation

## 2017-11-29 DIAGNOSIS — W57XXXA Bitten or stung by nonvenomous insect and other nonvenomous arthropods, initial encounter: Secondary | ICD-10-CM | POA: Insufficient documentation

## 2017-11-29 DIAGNOSIS — E039 Hypothyroidism, unspecified: Secondary | ICD-10-CM | POA: Diagnosis not present

## 2017-11-29 DIAGNOSIS — Y929 Unspecified place or not applicable: Secondary | ICD-10-CM | POA: Diagnosis not present

## 2017-11-29 MED ORDER — DIPHENHYDRAMINE HCL 25 MG PO TABS: 25.0000 mg | ORAL_TABLET | Freq: Three times a day (TID) | ORAL | 0 refills | Status: DC | PRN

## 2017-11-29 NOTE — ED Triage Notes (Signed)
Patient states that she was bit by something about 3 -4 this evening. She wrapped rubber bands and BP cuff around her ankle and calf - the patient states " I did that because it may be poisons"

## 2017-11-29 NOTE — ED Provider Notes (Signed)
[facility] EMERGENCY DEPARTMENT Provider Note   CSN: [id number] Arrival date & time: 11/29/17  1732     History   Chief Complaint Chief Complaint  Patient presents with  . Insect Bite    HPI Ann Frye is a 84 y.o. female resenting for evaluation of insect bite.  Patient states between 330 and 4 this afternoon she was outside walking around but she felt something sting the top of her foot.  When she went back in the house, she scratched at the top of her foot very hard.  She then noticed some redness and swelling, was concerned that she got something poisonous in her foot.  She was given a Benadryl around 445, this improved her symptoms.  She is currently having no itching, swelling, or pain.  She denies lesion elsewhere. Patient states to prevent the poison from spreading, she placed rubber bands and a blood pressure cuff around her leg.  HPI  Past Medical History:  Diagnosis Date  . Coronary artery disease    stent x 2  . High cholesterol   . Hypertension   . Stroke [facility])     Patient Active Problem List   Diagnosis Date Noted  . Ambulatory dysfunction 02/06/2017  . Hyponatremia 02/06/2017  . Accident due to mechanical fall without injury 01/14/2017  . Head trauma 01/14/2017  . Red blood cell antibody positive 01/02/2017  . Fall 01/01/2017  . Midline shift of brain 01/01/2017  . SDH (subdural hematoma) (HCC) 01/01/2017  . Foot drop, left 11/15/2016  . Coronary artery disease of native heart with stable angina pectoris (HCC) 09/21/2016  . Vitamin B12 deficiency 03/19/2016  . Dementia with behavioral disturbance 01/12/2016  . Memory difficulty 07/13/2015  . Atypical chest pain 05/09/2015  . Acute stress reaction 04/13/2015  . Age related osteoporosis 04/13/2015  . Cerebral artery occlusion 04/13/2015  . Coronary artery stenosis 04/13/2015  . Hypothyroidism 04/13/2015  . IFG (impaired fasting glucose) 04/13/2015  . Insomnia 04/13/2015  . Mild  depression (HCC) 04/13/2015  . Psoriasis 04/13/2015  . Vitamin D deficiency 04/13/2015  . ASCVD (arteriosclerotic cardiovascular disease) 11/29/2014  . Hypercholesteremia 11/29/2014  . Hypertension 11/29/2014    Past Surgical History:  Procedure Laterality Date  . CHOLECYSTECTOMY       OB History   None      Home Medications    Prior to Admission medications   Medication Sig Start Date End Date Taking? Authorizing Provider  acetaminophen (TYLENOL) 325 MG tablet Take 650 mg by mouth as needed. 01/16/17   [provider]  alendronate (FOSAMAX) 70 MG tablet Take 70 mg by mouth every 7 (seven) days. 03/20/16   [provider]  amLODipine (NORVASC) 10 MG tablet Take 1 tablet (10 mg total) by mouth daily. 02/24/17   Sunnie Nielsen, DO  aspirin 81 MG tablet Take 81 mg by mouth daily.    [provider]  Cholecalciferol (VITAMIN D3) 2000 units capsule Take by mouth daily. 09/30/16   [provider]  dimenhyDRINATE (DRAMAMINE) 50 MG tablet Take 1 tablet (50 mg total) by mouth every 6 (six) hours as needed (sleep). 02/06/17   Sunnie Nielsen, DO  diphenhydrAMINE (BENADRYL) 25 MG tablet Take 1 tablet (25 mg total) by mouth every 8 (eight) hours as needed. 11/29/17   Ellyson Rarick, PA-C  donepezil (ARICEPT) 10 MG tablet Take 10 mg by mouth at bedtime. 04/11/16   [provider]  escitalopram (LEXAPRO) 10 MG tablet Take 1 tablet by mouth daily.  01/31/17   [provider]  FLUAD 0.5 ML SUSY Inject 0.5 mLs into the muscle as directed. 11/27/16   [provider]  HYDROcodone-acetaminophen (NORCO/VICODIN) 5-325 MG tablet Take 1 tablet by mouth as needed. 01/31/17   [provider]  levETIRAcetam (KEPPRA) 750 MG tablet Take 1,500 mg by mouth 2 (two) times daily. 01/16/17   [provider]  levothyroxine (SYNTHROID, LEVOTHROID) 25 MCG tablet Take 25 mcg by mouth daily. 03/21/16   [provider]  lisinopril  (PRINIVIL,ZESTRIL) 40 MG tablet Take 1 tablet (40 mg total) by mouth daily. 03/06/17   Sunnie NielsenAlexander, Natalie, DO  meclizine (ANTIVERT) 25 MG tablet Take 12.5-25 mg by mouth 3 (three) times daily as needed for dizziness or nausea.    [provider]  Melatonin 3 MG CAPS Take 2 capsules (6 mg total) by mouth at bedtime as needed (insomnia). 02/06/17   Sunnie NielsenAlexander, Natalie, DO  metoprolol tartrate (LOPRESSOR) 25 MG tablet Take 1 tablet (25 mg total) by mouth every 6 (six) hours. 02/06/17   Sunnie NielsenAlexander, Natalie, DO  Misc. Devices (BATH BENCH WITH BACK) MISC 1 Units by Does not apply route as needed (with showering/bathing). 02/06/17   Sunnie NielsenAlexander, Natalie, DO  Misc. Devices (ROLLER WALKER) MISC Rollator walker with back support. Dx R26.2 02/10/17   Sunnie NielsenAlexander, Natalie, DO  nitroGLYCERIN (NITROSTAT) 0.4 MG SL tablet Place 1 tablet (0.4 mg total) under the tongue every 5 (five) minutes as needed. For chest pain 06/04/16   Sunnie NielsenAlexander, Natalie, DO  nortriptyline (PAMELOR) 25 MG capsule Take 1 capsule (25 mg total) by mouth 2 (two) times daily. 03/07/17   Sunnie NielsenAlexander, Natalie, DO  polyethylene glycol powder (GLYCOLAX/MIRALAX) powder Take 225 g by mouth as directed. 01/31/17   [provider]  rosuvastatin (CRESTOR) 40 MG tablet Take 40 mg by mouth at bedtime. 03/20/16   [provider]  tamsulosin (FLOMAX) 0.4 MG CAPS capsule Take 1 capsule (0.4 mg total) by mouth daily. 03/06/17   Sunnie NielsenAlexander, Natalie, DO  vitamin B-12 (CYANOCOBALAMIN) 1000 MCG tablet Take 1 tablet by mouth daily.    [provider]    Family History History reviewed. No pertinent family history.  Social History Social History   Tobacco Use  . Smoking status: Never Smoker  . Smokeless tobacco: Never Used  Substance Use Topics  . Alcohol use: Not on file  . Drug use: Not on file     Allergies   Codeine; Metoclopramide; and Zinc   Review of Systems Review of Systems  Musculoskeletal: Positive for joint  swelling (Foot swelling, resolved).  Skin: Positive for color change (Resolved).     Physical Exam Updated Vital Signs BP (!) 146/72 (BP Location: Left Arm)   Pulse 87   Temp 98.9 F (37.2 C) (Oral)   Resp 18   Ht 5' 4.5" (1.638 m)   Wt 57.2 kg   SpO2 100%   BMI 21.29 kg/m   Physical Exam  Constitutional: She is oriented to person, place, and time. She appears well-developed and well-nourished. No distress.  Appears in no distress  HENT:  Head: Normocephalic and atraumatic.  Eyes: EOM are normal.  Neck: Normal range of motion.  Pulmonary/Chest: Effort normal.  Abdominal: She exhibits no distension.  Musculoskeletal: Normal range of motion. She exhibits no edema or tenderness.  Pedal pulses intact bilaterally.  Soft compartments.  Neurological: She is alert and oriented to person, place, and time. No sensory deficit.  Skin: Skin is warm. No rash noted.  Area of excoriation  of the dorsal aspect of the right foot without inflammation, lesion, or swelling.  Pedal pulses intact bilaterally.  Good cap refill.  No obvious insect bite.   Psychiatric: She has a normal mood and affect.  Nursing note and vitals reviewed.    ED Treatments / Results  Labs (all labs ordered are listed, but only abnormal results are displayed) Labs Reviewed - No data to display  EKG None  Radiology No results found.  Procedures Procedures (including critical care time)  Medications Ordered in ED Medications - No data to display   Initial Impression / Assessment and Plan / ED Course  I have reviewed the triage vital signs and the nursing notes.  Pertinent labs & imaging results that were available during my care of the patient were reviewed by me and considered in my medical decision making (see chart for details).     Pt presenting for evaluation of possible insect bite.  Physical exam reassuring, no obvious signs of infection.  No swelling, warmth, or lesions.  Pedal pulses intact.   Patient is neurovascularly intact.  Area of excoriation.  Symptoms resolved with Benadryl.  Discussed with patient that she may have been bitten by an insect, she has continued itching or swelling she should use Benadryl and ice.  Discussed at length that patient is not to use rubber bands her blood pressure cuff to decrease circulation, as this could have very serious side effects/outcomes.  Patient to follow-up with PCP as needed for further itching and swelling.  At this time, patient appears safe for discharge.  Return precautions given.  Patient and son state they understand and agree to plan.  Final Clinical Impressions(s) / ED Diagnoses   Final diagnoses:  Insect bite of right foot, initial encounter    ED Discharge Orders         Ordered    diphenhydrAMINE (BENADRYL) 25 MG tablet  Every 8 hours PRN     11/29/17 1803           Alveria Apley, PA-C 11/29/17 2029    Rolan Bucco, MD 11/29/17 2350

## 2017-11-29 NOTE — Discharge Instructions (Addendum)
The most important thing is to not itch, scratch, or touch your foot. Use Benadryl as needed for itching or swelling. Use ice to help with pain and swelling. Follow-up with your primary care doctor for further evaluation of your symptoms as needed. Return with any new, worsening, or concerning symptoms.

## 2018-03-11 ENCOUNTER — Other Ambulatory Visit: Payer: Self-pay | Admitting: Osteopathic Medicine

## 2018-05-13 IMAGING — DX DG TIBIA/FIBULA 2V*L*
4 series · 4 of 4 positions shown · non-contrast
Comparison: No recent prior .

CLINICAL DATA: Lower extremity pain.  Prior surgery.  Recent fall.

EXAM:
LEFT TIBIA AND FIBULA - 2 VIEW

[tibia ap (1 of 2)]
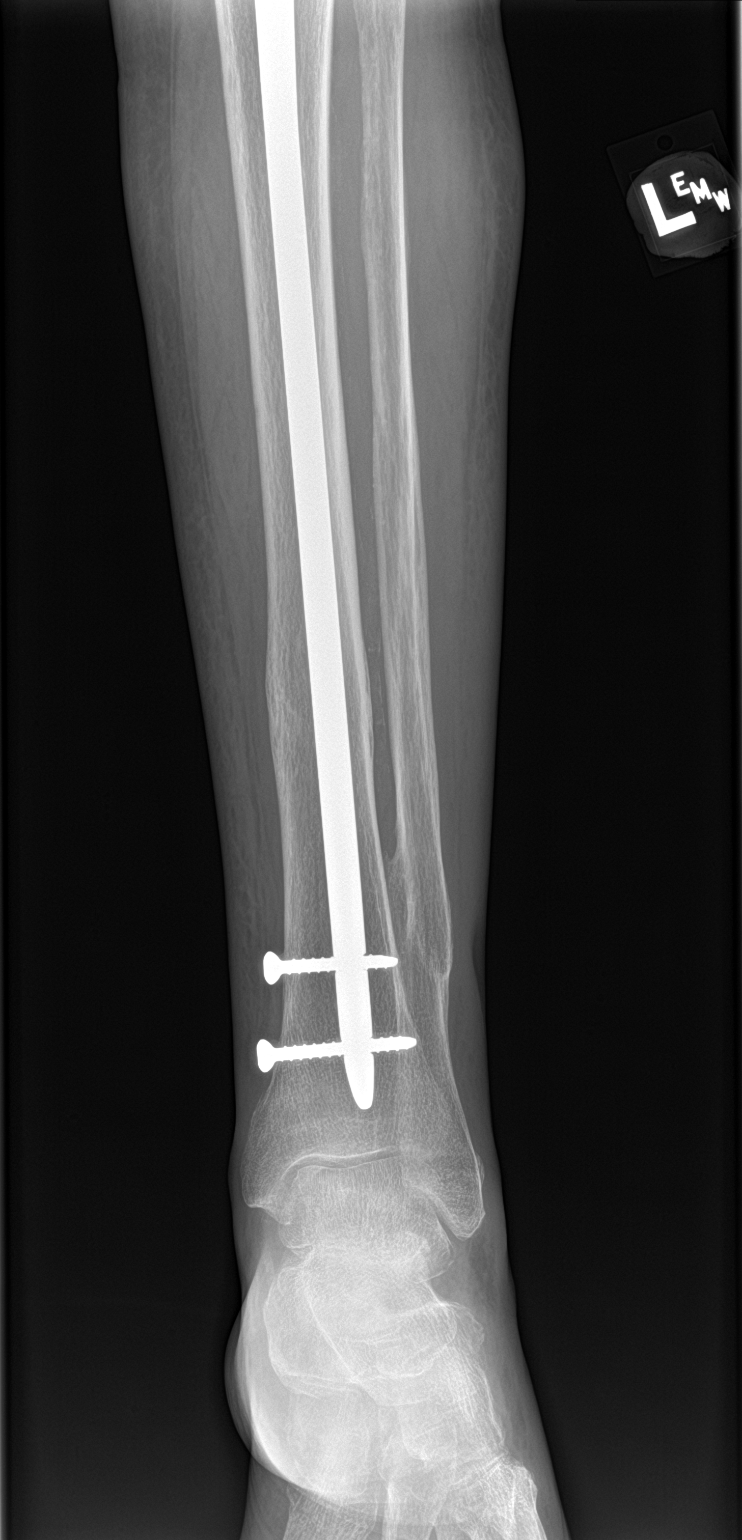

[tibia ap (2 of 2)]
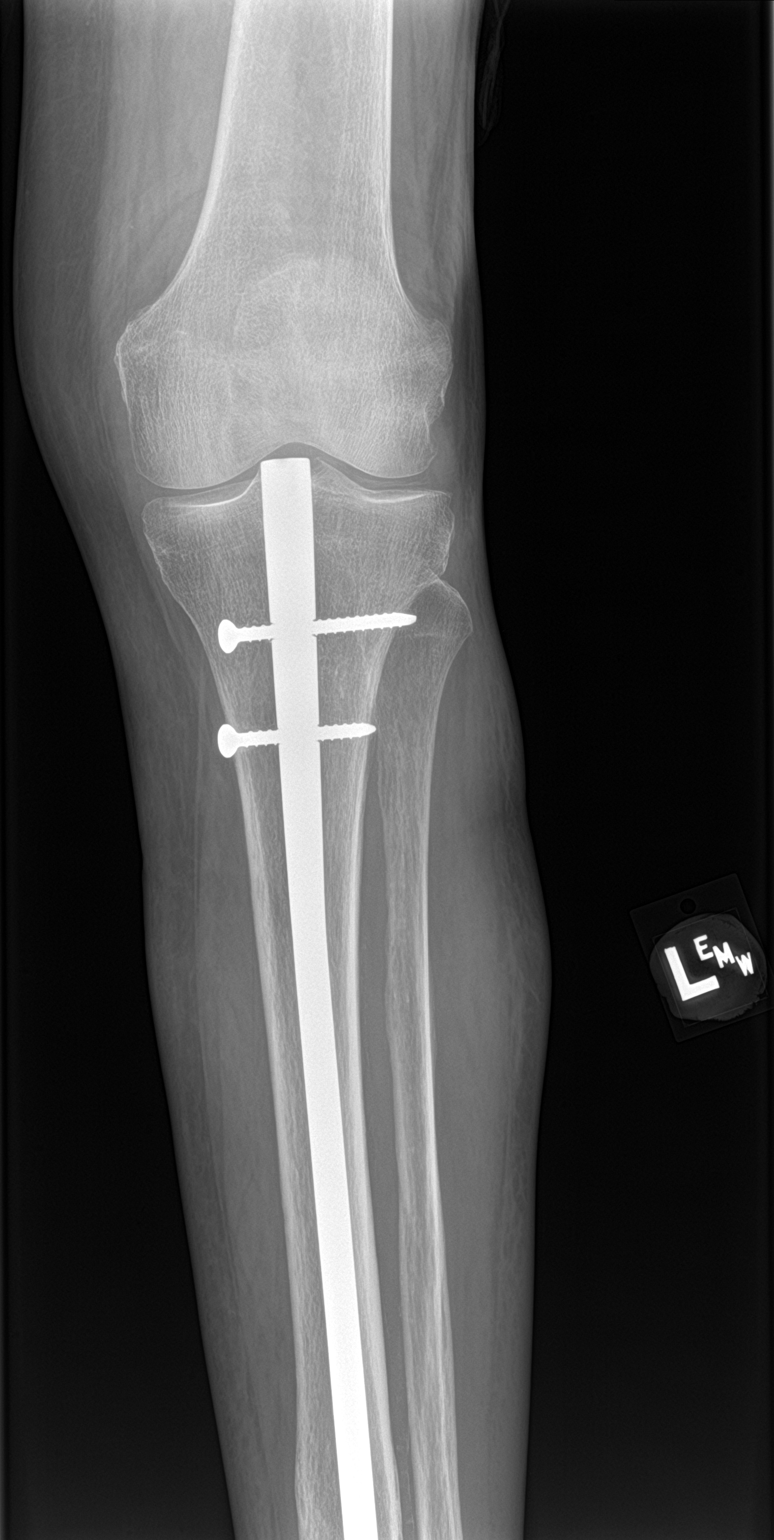

[tibia lat (1 of 2)]
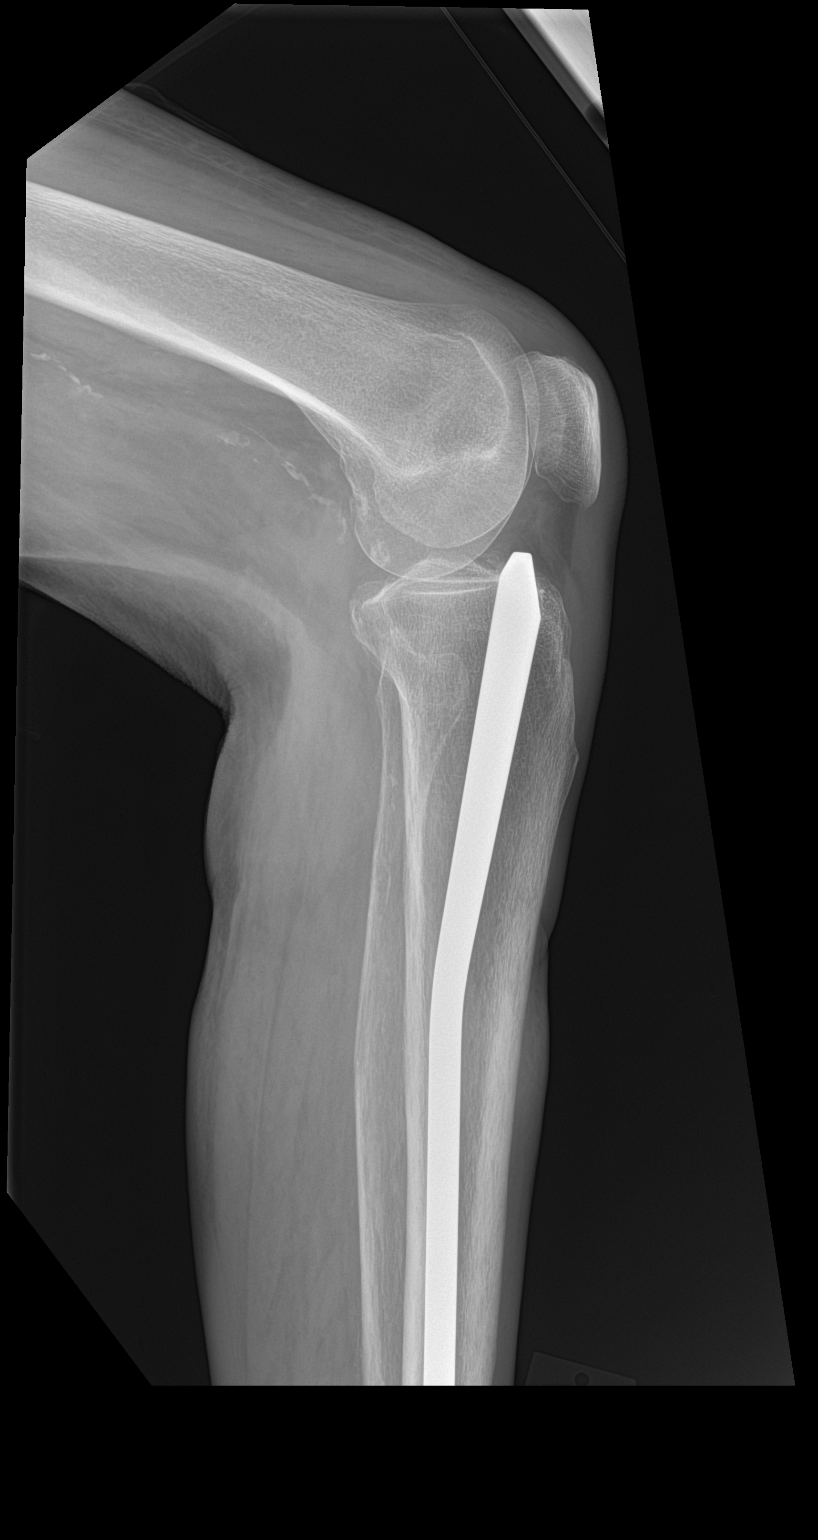

[tibia lat (2 of 2)]
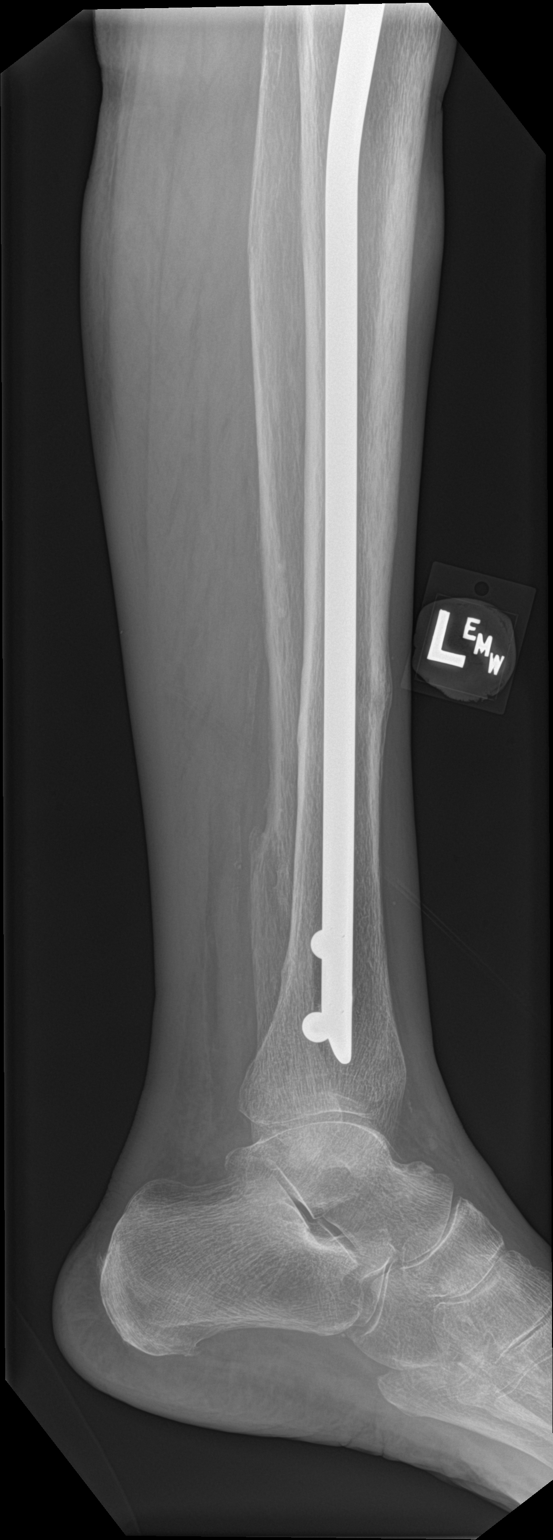

[4 of 4 positions shown; findings below may reference images not displayed]

FINDINGS: Prior ORIF left tibia. Hardware intact. Anatomic alignment. Old
healed fracture of the distal fibula noted. No acute abnormality
identified. Peripheral vascular calcification.
IMPRESSION: 1. No acute bony abnormality.

2. Prior ORIF left tibia.  Hardware intact.  Anatomic alignment.

3. Peripheral vascular disease .

## 2018-07-17 ENCOUNTER — Telehealth: Payer: Self-pay

## 2018-07-17 NOTE — Telephone Encounter (Signed)
Noted, thanks!

## 2018-07-17 NOTE — Telephone Encounter (Signed)
[facility] called for verbal orders for physical therapy. I returned the call and advised them that it looks like the patient is no longer under Dr Alexander's care. I gave [facility] the name of the PCP she is currently seeing.   Cipriano Bunker, MD  [address] [place], [place] [address]  [phone]  [phone] (Fax)

## 2018-07-21 ENCOUNTER — Telehealth: Payer: Self-pay

## 2018-07-21 ENCOUNTER — Inpatient Hospital Stay: Payer: Medicare HMO | Admitting: Osteopathic Medicine

## 2018-07-21 NOTE — Telephone Encounter (Signed)
Pt was scheduled to be seen in the office today at 8:30 am. Provider no longer listed as pt's primary provider.Pt did not show for her appt. Pt left a vm msg stating she was waiting for someone to contact her for a virtual visit. An attempt was made to return call back to pt - informed by person who answered, pt does not live in household. No other info provided.

## 2018-07-23 ENCOUNTER — Encounter: Payer: Self-pay | Admitting: Osteopathic Medicine

## 2018-07-23 ENCOUNTER — Ambulatory Visit (INDEPENDENT_AMBULATORY_CARE_PROVIDER_SITE_OTHER): Payer: Medicare HMO | Admitting: Osteopathic Medicine

## 2018-07-23 ENCOUNTER — Telehealth: Payer: Self-pay | Admitting: Osteopathic Medicine

## 2018-07-23 VITALS — BP 125/53 | HR 68 | Temp 98.9°F | Wt 141.4 lb

## 2018-07-23 DIAGNOSIS — Z8659 Personal history of other mental and behavioral disorders: Secondary | ICD-10-CM

## 2018-07-23 DIAGNOSIS — F0391 Unspecified dementia with behavioral disturbance: Secondary | ICD-10-CM

## 2018-07-23 MED ORDER — ASPIRIN 81 MG PO TABS: 81.0000 mg | ORAL_TABLET | Freq: Every day | ORAL | 3 refills | Status: AC

## 2018-07-23 MED ORDER — ROSUVASTATIN CALCIUM 40 MG PO TABS: 40.0000 mg | ORAL_TABLET | Freq: Every day | ORAL | 3 refills | Status: AC

## 2018-07-23 MED ORDER — MULTIVITAMINS PO CAPS: 1.0000 | ORAL_CAPSULE | Freq: Every day | ORAL | Status: AC

## 2018-07-23 MED ORDER — VITAMIN B-12 1000 MCG PO TABS: 1000.0000 ug | ORAL_TABLET | Freq: Every day | ORAL | 3 refills | Status: AC

## 2018-07-23 MED ORDER — ACETAMINOPHEN 325 MG PO TABS: 650.0000 mg | ORAL_TABLET | Freq: Three times a day (TID) | ORAL | 3 refills | Status: AC | PRN

## 2018-07-23 MED ORDER — LEVOTHYROXINE SODIUM 25 MCG PO TABS: 25.0000 ug | ORAL_TABLET | Freq: Every day | ORAL | 1 refills | Status: AC

## 2018-07-23 MED ORDER — METOPROLOL TARTRATE 25 MG PO TABS: 25.0000 mg | ORAL_TABLET | Freq: Two times a day (BID) | ORAL | 1 refills | Status: AC

## 2018-07-23 MED ORDER — VITAMIN D3 50 MCG (2000 UT) PO CAPS: 2000.0000 [IU] | ORAL_CAPSULE | Freq: Every day | ORAL | 3 refills | Status: AC

## 2018-07-23 MED ORDER — MECLIZINE HCL 25 MG PO TABS: 12.5000 mg | ORAL_TABLET | Freq: Three times a day (TID) | ORAL | 1 refills | Status: AC | PRN

## 2018-07-23 MED ORDER — AMLODIPINE BESYLATE 5 MG PO TABS: 5.0000 mg | ORAL_TABLET | Freq: Every day | ORAL | 1 refills | Status: DC

## 2018-07-23 MED ORDER — ESCITALOPRAM OXALATE 20 MG PO TABS: 20.0000 mg | ORAL_TABLET | Freq: Every day | ORAL | 3 refills | Status: AC

## 2018-07-23 MED ORDER — ALENDRONATE SODIUM 70 MG PO TABS: 70.0000 mg | ORAL_TABLET | ORAL | 3 refills | Status: AC

## 2018-07-23 MED ORDER — NITROGLYCERIN 0.4 MG SL SUBL: 0.4000 mg | SUBLINGUAL_TABLET | SUBLINGUAL | 1 refills | Status: AC | PRN

## 2018-07-23 MED ORDER — DONEPEZIL HCL 10 MG PO TABS: 10.0000 mg | ORAL_TABLET | Freq: Every day | ORAL | 3 refills | Status: AC

## 2018-07-23 NOTE — Telephone Encounter (Signed)
Received call from Riza [phone]) for verbal with [facility] order.  2x week for 2 weeks.   Verbal given.

## 2018-07-23 NOTE — Telephone Encounter (Signed)
Noted, thanks!

## 2018-07-23 NOTE — Patient Instructions (Addendum)
Plan:  See attached medication list  Please make sure all meds are as listed!   Let's give it 2 weeks on these medications and reevaluate   Hospital recommended being on Gabapentin 100 mg twice daily and Olanzapine 5 mg daily, we are HOLDING these for now.

## 2018-07-23 NOTE — Progress Notes (Signed)
HPI: Ann Frye is a 85 y.o. adult who  has a past medical history of Coronary artery disease, High cholesterol, Hypertension, and Stroke (HCC).  he presents to [facility] today, 07/23/18,  for chief complaint of: Hospital follow-up Reestablish care   Was living with her son in [place], had different physician there.  Here to reestablish care now that she is living with her daughter.  Daughter has some concerns about medications.   Discharge summary reviewed: Admitted 07/08/2018, discharged 07/15/2018. Ann Frye presented to ER with the [employer] IV[employer], behavioral changes and auditory hallucinations.  Patient was living with her son and daughter-in-law.  Psychiatry did determine that she had capacity to make informed decisions and reassign her healthcare POA and durable POA to whoever she wants.   The daughter and the patient have a bit of a different story from the notes presented in the hospital records, they seem to think that the patient's son was trying to get her declared mentally incompetent so that he could have control of her finances.  The patient does not think she is experiencing any hallucinations since she did not have any in the hospital or since leaving the hospital, she is convinced that her daughter-in-law was making noise in the house to make her seem crazy.  Follow-up appointments currently in place: Has follow-up with other PCP, Dr. Lucianne Muss.  Advised daughter to call this person to cancel the appointment if the patient will not be going. 09/28/2018: Neurology 902 2020: Cardiology   Patient is accompanied by daughter, Ann Frye, who assists with history-taking.     Past medical, surgical, social and family history reviewed:  Patient Active Problem List   Diagnosis Date Noted  . Ambulatory dysfunction 02/06/2017  . Hyponatremia 02/06/2017  . Accident due to mechanical fall without injury 01/14/2017  . Head trauma 01/14/2017   . Red blood cell antibody positive 01/02/2017  . Fall 01/01/2017  . Midline shift of brain 01/01/2017  . SDH (subdural hematoma) (HCC) 01/01/2017  . Foot drop, left 11/15/2016  . Coronary artery disease of native heart with stable angina pectoris (HCC) 09/21/2016  . Vitamin B12 deficiency 03/19/2016  . Dementia with behavioral disturbance (HCC) 01/12/2016  . Memory difficulty 07/13/2015  . Atypical chest pain 05/09/2015  . Acute stress reaction 04/13/2015  . Age related osteoporosis 04/13/2015  . Cerebral artery occlusion 04/13/2015  . Coronary artery stenosis 04/13/2015  . Hypothyroidism 04/13/2015  . IFG (impaired fasting glucose) 04/13/2015  . Insomnia 04/13/2015  . Mild depression (HCC) 04/13/2015  . Psoriasis 04/13/2015  . Vitamin D deficiency 04/13/2015  . ASCVD (arteriosclerotic cardiovascular disease) 11/29/2014  . Hypercholesteremia 11/29/2014  . Hypertension 11/29/2014    Past Surgical History:  Procedure Laterality Date  . CHOLECYSTECTOMY      Social History   Tobacco Use  . Smoking status: Never Smoker  . Smokeless tobacco: Never Used  Substance Use Topics  . Alcohol use: Not on file    No family history on file.   Current medication list and allergy/intolerance information reviewed:    Medications were reconciled from hospital discharge summary, patient's home medication pill bottles, patient's home medication list .  Occasions matched up okay, but there were some differences.  Hospital had started her on olanzapine and her previous PCP had started her on gabapentin, she is not on either of these medications.  She thinks she still might be taking old amitriptyline.  There was some dosage adjustments made to amlodipine.  Allergies  Allergen Reactions  . Metoclopramide Other (See Comments)    unknown   . Zinc Other (See Comments)    unknown   . Codeine       Review of Systems:  Constitutional:  No  fever, no chills, No unintentional weight  changes. No significant fatigue.   HEENT: No  headache, no vision change  Cardiac: No  chest pain, No  pressure  Respiratory:  No  shortness of breath. No  Cough  Gastrointestinal: No  abdominal pain, No  nausea  Musculoskeletal: No new myalgia/arthralgia  Hem/Onc: No  easy bruising/bleeding  Neurologic: No  weakness, No  dizziness, No  slurred speech/focal weakness/facial droop   Psychiatric: No  concerns with depression, +concerns with anxiety -reports she would like something for shaking/nerves, No sleep problems, No mood problems  Exam:  BP (!) 125/53 (BP Location: Left Arm, Patient Position: Sitting, Cuff Size: Normal)   Pulse 68   Temp 98.9 F (37.2 C) (Oral)   Wt 141 lb 6.4 oz (64.1 kg)   BMI 23.90 kg/m   Constitutional: VS see above. General Appearance: alert, well-developed, well-nourished, NAD  Eyes: Normal lids and conjunctive, non-icteric sclera  Ears, Nose, Mouth, Throat: MMM, Normal external inspection ears/nares/mouth/lips/gums.    Neck: No masses, trachea midline. No thyroid enlargement. No tenderness/mass appreciated. No lymphadenopathy  Respiratory: Normal respiratory effort. no wheeze, no rhonchi, no rales  Cardiovascular: S1/S2 normal, +murmur, no rub/gallop auscultated. RRR. Trace lower extremity edema.   Gastrointestinal: Nontender, no masses.  Musculoskeletal: Gait normal. No clubbing/cyanosis of digits.   Neurological: Normal balance/coordination. No tremor. No cranial nerve deficit on limited exam.   Skin: warm, dry, intact. No rash/ulcer.   Psychiatric: Normal judgment/insight. Normal mood and affect. Oriented x person, place    No results found for this or any previous visit (from the past 72 hour(s)).  No results found.   ASSESSMENT/PLAN: The primary encounter diagnosis was History of psychosis. A diagnosis of Dementia with behavioral disturbance, unspecified dementia type [facility]) was also pertinent to this visit.  Patient and her  daughter about medications in particular.  The patient is very reluctant to get on the medications that psychiatry had recommended for her, she has been off of the Zyprexa and the gabapentin since being out of the hospital and feels that she is doing fine.  She appears well today, I think increasing the dose of Lexapro may need some more time to be effective, I am willing to stay off of the mood stabilizers and gabapentin for now but I think I would have a low threshold for restarting these.  We will keep a close eye on the patient over the next couple of weeks to a month.  Daughter is advised to let me know if she observes anything suspicious.  The medical record is a bit in conflict with the patient's story and her daughters regarding her mental health, but psychiatry in the hospital did deem her to have capacity to make decisions for herself, so I think we can work with her a bit.  Reviewed from recent hospitalizations, no concerns, I do not feel the need to repeat any blood work at this time.   Meds ordered this encounter  Medications  . amLODipine (NORVASC) 5 MG tablet    Sig: Take 1 tablet (5 mg total) by mouth daily.    Dispense:  30 tablet    Refill:  1  . aspirin 81 MG tablet    Sig: Take 1 tablet (81 mg  total) by mouth daily.    Dispense:  90 tablet    Refill:  3  . acetaminophen (TYLENOL) 325 MG tablet    Sig: Take 2 tablets (650 mg total) by mouth 3 (three) times daily as needed for moderate pain.    Dispense:  90 tablet    Refill:  3  . alendronate (FOSAMAX) 70 MG tablet    Sig: Take 1 tablet (70 mg total) by mouth every 7 (seven) days.    Dispense:  12 tablet    Refill:  3  . Cholecalciferol (VITAMIN D3) 50 MCG (2000 UT) capsule    Sig: Take 1 capsule (2,000 Units total) by mouth daily.    Dispense:  90 capsule    Refill:  3  . donepezil (ARICEPT) 10 MG tablet    Sig: Take 1 tablet (10 mg total) by mouth at bedtime.    Dispense:  90 tablet    Refill:  3  . escitalopram  (LEXAPRO) 20 MG tablet    Sig: Take 1 tablet (20 mg total) by mouth at bedtime.    Dispense:  90 tablet    Refill:  3  . levothyroxine (SYNTHROID) 25 MCG tablet    Sig: Take 1 tablet (25 mcg total) by mouth daily before breakfast.    Dispense:  90 tablet    Refill:  1  . meclizine (ANTIVERT) 25 MG tablet    Sig: Take 0.5-1 tablets (12.5-25 mg total) by mouth 3 (three) times daily as needed for dizziness or nausea.    Dispense:  30 tablet    Refill:  1  . metoprolol tartrate (LOPRESSOR) 25 MG tablet    Sig: Take 1 tablet (25 mg total) by mouth 2 (two) times daily.    Dispense:  180 tablet    Refill:  1  . nitroGLYCERIN (NITROSTAT) 0.4 MG SL tablet    Sig: Place 1 tablet (0.4 mg total) under the tongue every 5 (five) minutes as needed. For chest pain    Dispense:  15 tablet    Refill:  1  . rosuvastatin (CRESTOR) 40 MG tablet    Sig: Take 1 tablet (40 mg total) by mouth at bedtime.    Dispense:  90 tablet    Refill:  3  . vitamin B-12 (CYANOCOBALAMIN) 1000 MCG tablet    Sig: Take 1 tablet (1,000 mcg total) by mouth daily.    Dispense:  90 tablet    Refill:  3  . Multiple Vitamin (MULTIVITAMIN) capsule    Sig: Take 1 capsule by mouth daily.    Patient Instructions  Plan:  See attached medication list  Please make sure all meds are as listed!   Let's give it 2 weeks on these medications and reevaluate   Hospital recommended being on Gabapentin 100 mg twice daily and Olanzapine 5 mg daily, we are HOLDING these for now.          Visit summary with medication list and pertinent instructions was printed for patient to review. All questions at time of visit were answered - patient instructed to contact office with any additional concerns or updates. ER/RTC precautions were reviewed with the patient.   Note: Total time spent 40 minutes, greater than 50% of the visit was spent face-to-face counseling and coordinating care for the above diagnoses listed in assessment/plan.    Please note: voice recognition software was used to produce this document, and typos may escape review. Please contact Dr. Lyn HollingsheadAlexander for any needed clarifications.  Follow-up plan: Return in about 2 weeks (around 08/06/2018) for recheck on medications (OV40) or see me / call sooner if needed! Marland Kitchen

## 2018-07-30 ENCOUNTER — Telehealth: Payer: Self-pay | Admitting: Osteopathic Medicine

## 2018-07-30 MED ORDER — GENERIC EXTERNAL MEDICATION
500.00 | Status: DC
Start: 2018-07-31 — End: 2018-07-30

## 2018-07-30 MED ORDER — DONEPEZIL HCL 10 MG PO TABS
10.00 | ORAL_TABLET | ORAL | Status: DC
Start: 2018-07-30 — End: 2018-07-30

## 2018-07-30 MED ORDER — ESCITALOPRAM OXALATE 10 MG PO TABS
20.00 | ORAL_TABLET | ORAL | Status: DC
Start: 2018-07-31 — End: 2018-07-30

## 2018-07-30 MED ORDER — GENERIC EXTERNAL MEDICATION
1.00 | Status: DC
Start: 2018-07-31 — End: 2018-07-30

## 2018-07-30 MED ORDER — LEVOTHYROXINE SODIUM 25 MCG PO TABS
25.00 | ORAL_TABLET | ORAL | Status: DC
Start: 2018-07-31 — End: 2018-07-30

## 2018-07-30 MED ORDER — HYDRALAZINE HCL 20 MG/ML IJ SOLN
10.00 | INTRAMUSCULAR | Status: DC
Start: ? — End: 2018-07-30

## 2018-07-30 MED ORDER — GENERIC EXTERNAL MEDICATION
Status: DC
Start: ? — End: 2018-07-30

## 2018-07-30 MED ORDER — ROSUVASTATIN CALCIUM 20 MG PO TABS
40.00 | ORAL_TABLET | ORAL | Status: DC
Start: 2018-07-31 — End: 2018-07-30

## 2018-07-30 MED ORDER — LABETALOL HCL 5 MG/ML IV SOLN
10.00 | INTRAVENOUS | Status: DC
Start: ? — End: 2018-07-30

## 2018-07-30 MED ORDER — APIXABAN 2.5 MG PO TABS
5.00 | ORAL_TABLET | ORAL | Status: DC
Start: 2018-07-30 — End: 2018-07-30

## 2018-07-30 MED ORDER — OLANZAPINE 5 MG PO TABS
5.00 | ORAL_TABLET | ORAL | Status: DC
Start: 2018-07-30 — End: 2018-07-30

## 2018-07-30 MED ORDER — METOPROLOL TARTRATE 25 MG PO TABS
25.00 | ORAL_TABLET | ORAL | Status: DC
Start: 2018-07-30 — End: 2018-07-30

## 2018-07-30 NOTE — Telephone Encounter (Signed)
FYI: Ms. Ann Frye called. She cancelled Ms. Ann Frye's appointment because she is in [facility] due to a massive stroke.  Thanks

## 2018-08-10 ENCOUNTER — Ambulatory Visit: Payer: Medicare HMO | Admitting: Osteopathic Medicine

## 2018-08-31 ENCOUNTER — Telehealth: Payer: Self-pay

## 2018-08-31 NOTE — Telephone Encounter (Signed)
Amy from [facility] called requesting a verbal order for evaluation and hospice placement. Verbal order given. She will fax any necessary paperwork to the office. No other inquiries during call.

## 2018-09-01 ENCOUNTER — Telehealth: Payer: Self-pay

## 2018-09-01 NOTE — Telephone Encounter (Signed)
Cyndia Bent RN from [facility] left a vm msg. She wanted provider to be aware that pt was admitted today into hospice care. She is requesting a call back from provider to discuss pt's care. She can be reached at [phone].

## 2018-09-01 NOTE — Telephone Encounter (Signed)
No answer. I left message to call Vanicia back at her direct number to schedule a time to discuss a mutual patient.

## 2018-09-01 NOTE — Telephone Encounter (Signed)
Ms. Ann Frye called - wanted provider to be aware that pt health is declining rapidly. Pt has terminal cancer. Family is requesting to stop the following meds: Eliquis, fosamax, vit d3, vit b12, and crestor. New rxs will be morphine, oxycodone and ativan to help pt be more comfortable. Hospice will continue to keep pt on current BP meds. However, Ann Frye will be keeping a watch on pt's bp because it's been running low. Today's reading was 122/47. She will contact provider of any changes.

## 2018-09-01 NOTE — Telephone Encounter (Signed)
That's fine I have no problem stopping those. Plan sounds good to me! Sorry to hear about this.

## 2018-09-19 ENCOUNTER — Other Ambulatory Visit: Payer: Self-pay | Admitting: Osteopathic Medicine

## 2018-10-10 DEATH — deceased
